# Patient Record
Sex: Male | Born: 1950 | Race: White | Hispanic: No | Smoking: Former smoker
Health system: Southern US, Community
[De-identification: ages and names within clinical notes are randomized; demographics above are authoritative.]

---

## 2007-12-18 ENCOUNTER — Other Ambulatory Visit: Payer: Self-pay

## 2007-12-18 ENCOUNTER — Inpatient Hospital Stay: Payer: Self-pay | Admitting: Specialist

## 2007-12-20 ENCOUNTER — Inpatient Hospital Stay: Payer: Self-pay | Admitting: Psychiatry

## 2008-06-14 ENCOUNTER — Observation Stay: Payer: Self-pay | Admitting: Internal Medicine

## 2008-06-14 ENCOUNTER — Inpatient Hospital Stay: Payer: Self-pay | Admitting: Unknown Physician Specialty

## 2009-08-02 ENCOUNTER — Inpatient Hospital Stay: Payer: Self-pay | Admitting: Unknown Physician Specialty

## 2010-10-28 ENCOUNTER — Emergency Department: Payer: Self-pay | Admitting: Emergency Medicine

## 2013-01-31 ENCOUNTER — Observation Stay: Payer: Self-pay | Admitting: Student

## 2013-01-31 LAB — CBC
HGB: 13.3 g/dL (ref 13.0–18.0)
MCHC: 34.3 g/dL (ref 32.0–36.0)
MCV: 95 fL (ref 80–100)
Platelet: 311 10*3/uL (ref 150–440)
RBC: 4.06 10*6/uL — ABNORMAL LOW (ref 4.40–5.90)
RDW: 14 % (ref 11.5–14.5)
WBC: 11.1 10*3/uL — ABNORMAL HIGH (ref 3.8–10.6)

## 2013-01-31 LAB — BASIC METABOLIC PANEL
Anion Gap: 7 (ref 7–16)
BUN: 9 mg/dL (ref 7–18)
Calcium, Total: 8.9 mg/dL (ref 8.5–10.1)
Co2: 25 mmol/L (ref 21–32)
Creatinine: 0.87 mg/dL (ref 0.60–1.30)
EGFR (Non-African Amer.): >60
Osmolality: 270 (ref 275–301)
Sodium: 136 mmol/L (ref 136–145)

## 2013-01-31 LAB — CK TOTAL AND CKMB (NOT AT ARMC)
CK, Total: 57 U/L (ref 35–232)
CK-MB: <0.5 ng/mL — ABNORMAL LOW (ref 0.5–3.6)

## 2013-01-31 LAB — TROPONIN I
Troponin-I: <0.02 ng/mL
Troponin-I: <0.02 ng/mL
Troponin-I: <0.02 ng/mL

## 2013-02-01 LAB — LIPID PANEL
Cholesterol: 199 mg/dL (ref 0–200)
HDL Cholesterol: 51 mg/dL (ref 40–60)
Triglycerides: 97 mg/dL (ref 0–200)
VLDL Cholesterol, Calc: 19 mg/dL (ref 5–40)

## 2013-02-01 LAB — HEMOGLOBIN A1C: Hemoglobin A1C: 5.4 % (ref 4.2–6.3)

## 2014-07-03 NOTE — Discharge Summary (Signed)
PATIENT NAME:  Randall Randall [name], Randall Randall [name] MR#:  [id number] DATE OF BIRTH:  07-09-1950  DATE OF ADMISSION:  01/31/2013 DATE OF DISCHARGE:  02/01/2013   PRIMARY CARE PHYSICIAN[facility]   CHIEF COMPLAINT: Constipation and episode of chest pain.   DISCHARGE DIAGNOSES: 1.  Chest pain, likely gastrointestinal-related secondary to constipation.  2.  History of major depression.  3.  History of alcohol abuse in the past.  4.  History of deep vein thrombosis, no longer on Coumadin.   DISCHARGE MEDICATIONS: None, but the patient was advised to take over-the-counter fiber and stool bulking agent such as Metamucil.   DISPOSITION: Home.   SIGNIFICANT LABORATORIES AND IMAGING: Troponins negative x 3, CK-MB not elevated x 3. White count of 11.1 on admission. LDL 129. Sodium 136, potassium 3.2. Chest x-ray, PA and lateral, showing COPD, no evidence of acute cardiopulmonary disease.   HISTORY OF PRESENT ILLNESS AND HOSPITAL COURSE: For full details of H and P, please see the dictation by Dr. Luberta MutterKonidena on November 21 but briefly, this is Randall [name] 64 year old without any active medical conditions. He came in for chest pain with atypical features. Pain was between the shoulder blades, going down to his belly. The patient has been feeling nausea without vomiting and has been having significant constipation. The patient has been having bowel movements daily prior, but now has not had Randall [name] bowel movement in close to Randall [name] week. He was admitted to the hospitalist service on telemetry. He underwent cyclic cardiac markers, which were all negative. He had no further episodes of chest pain. Randall [name] stress test had been ordered for the patient; however, nuclear medicine had somehow not gotten the order. The patient has been chest pain-free since he had been given aspirin in the ER. At this point, he was given Dulcolax  suppository with some relief with Randall [name] bowel movement. I believe his "chest pain" is likely GI-related, and he also states that  his pain did get better with belching. He was advised to take over-the-counter stool bulking agents. He will be discharged with outpatient followup, and he verbalized understanding that he will contact his PCP if he has any further episodes of chest pain, and Randall [name] stress test could be done as an outpatient.   TOTAL TIME SPENT: 35 minutes.   CODE STATUS: The patient is full code.   ____________________________ Krystal EatonShayiq Delanee Xin, MD sa:jcm D: 02/01/2013 13:18:38 ET T: 02/01/2013 13:53:44 ET JOB#: [id number]  cc: Krystal EatonShayiq Adyn Serna, MD, <Dictator> [facility] Marcelle SmilingSHAYIQ [facility] MD ELECTRONICALLY SIGNED 02/15/2013 19:10

## 2014-07-03 NOTE — H&P (Signed)
PATIENT NAME:  Randall [name], [name] MR#:  [id number] DATE OF BIRTH:  18-Oct-1950  DATE OF ADMISSION:  01/31/2013  PRIMARY CARE PHYSICIAN:  Nonlocal. He says he does not remember seeing a physician like a long time ago and not able to remember the name.  ER PHYSICIAN:  Dr. Darnelle CatalanMalinda.  CHIEF COMPLAINT:  Chest pain.   HISTORY OF PRESENT ILLNESS:  The patient is a 64 year old male with no past medical history, who came in because of chest pain. The patient says the chest pain started about one week ago, mainly in the middle of the chest and going back to the shoulder blades on the left side and also going down to his abdomen. The patient feels nauseous since the last two days, unable to get any vomiting. Denies any sweating, no shortness of breath. No cough. No fever, and chest pain not relieved with the Tylenol that he took and also not relieved with Advil. He also tried TUMS with no relief. The patient says that he has received aspirin in the ER that helped him with chest pain. The patient also says he is constipated for one week, tried all the stool softeners with no relief. The patient states that he is working on his roof and lifting weights for about two weeks and he does not think it is a muscle pull.  PAST MEDICAL HISTORY:  Significant for a history of depression and [facility]. The patient has a history of major depression, alcohol dependence, but not taking any medications at this time. Significant for a history of DVT. He says it is a year ago and he was on Coumadin for the past and was stopped by PMD because he has no more DVT.   ALLERGIES:  No known allergies.   SOCIAL HISTORY:  The patient says he is a smoker but not smoked for about a week, and no alcohol. No drugs.   FAMILY HISTORY:  Mother had hypertension and also his aunt has a heart attack about age 65.   PAST SURGICAL HISTORY:  None.   REVIEW OF SYSTEMS: CONSTITUTIONAL:  No fever. No fatigue.  EYES:   No blurred vision.  ENT:  No tinnitus. No ear pain. No epistaxis. No difficulty swallowing.  RESPIRATORY:  No cough. No wheezing.  CARDIOVASCULAR:  Has chest pain for a week, no orthopnea. No pedal edema. No PND. No palpitations.  GASTROINTESTINAL:  Has nausea but no vomiting. No diarrhea, does have constipation. No abdominal pain.  GENITOURINARY:  No dysuria.  ENDOCRINE:  No polyphagia. No nocturia.   hematological;>. No anemia.  INTEGUMENTARY:  No skin rashes.  MUSCULOSKELETAL:  No joint pains.  NEUROLOGIC:  No numbness or weakness.  PSYCHIATRIC:  Anxiety or insomnia.   PHYSICAL EXAMINATION:  VITAL SIGNS:  Temperature 97.7, heart rate 84, blood pressure 168/80, sats 98% on room air.  GENERAL:  Awake, oriented, Not in distress. Answers all questions appropriately.  HEENT:  Head normocephalic, atraumatic.  EYES:  Pupils equally reacting to light, no conjunctival pallor. No scleral icterus.  NOSE:  No nasal lesions.  EARS:  No tympanic membrane congestion. No drainage.  MOUTH:  No lesions.  NECK:  Supple. No JVD. No carotid bruit. Normal range of motion.  RESPIRATORY:  Good respiratory effort. Clear to auscultation. No wheeze. No rales.  CARDIOVASCULAR:  S1, S2 regular. No murmurs. No gallops. The patient has no peripheral edema. Pulses are equal in carotid ,dorsalis pedis and femoral artery.  CHEST:  The patient does not have any chest wall tenderness.  GASTROINTESTINAL:  Abdomen is soft, nontender, nondistended. Bowel sounds present.  MUSCULOSKELETAL:  The patient's extremities moves x 4. No tenderness or effusion. Normal range of motion. NEUROLOGIC:  Cranial nerves II through XII intact. Power 5/5 in upper and lower extremities.  (dtr 2+ bilaterally. sensations are intact NEUROLOGICAL:  Oriented to time, place, person. No focal neurological deficit.   LABORATORY DATA:  Troponin less than 0.02. WBC 11.1, hemoglobin 13.3, hematocrit 38.7, platelets 311.   ELECTROLYTES:  Sodium 136,  potassium 3.2, chloride 104, bicarb 25, BUN 9, creatinine 0.87, glucose 83.   Chest x-ray shows COPD with no evidence of acute disease.   EKG:  Normal sinus at 73 beats per minute. No ST-T changes.   ASSESSMENT AND PLAN:   1.  The patient is a 64 year old male with chest pain, sounds very atypical, and he says that he does feel better after burping, but because of his advanced age we want him to be admitted to observation and continue aspirin, beta blockers. Obtain fasting lipids. Continue nitroglycerin. Obtain a stress test in the morning.  2.  Hypokalemia, replace the potassium. 3.  Constipation. Use stool softeners and MiraLAX.  4.  Possible gastroesophageal reflux disease. Continue proton pump inhibitor, continue  Mylanta as needed.   TIME SPENT ON HISTORY AND PHYSICAL:  About 55 minutes.   ____________________________ Katha Hamming, MD sk:jm D: 01/31/2013 17:09:17 ET T: 01/31/2013 17:38:26 ET JOB#: [id number]  cc: Katha Hamming, MD, <Dictator> Katha Hamming MD ELECTRONICALLY SIGNED 02/19/2013 22:34

## 2017-06-25 ENCOUNTER — Other Ambulatory Visit: Payer: Self-pay | Admitting: Family Medicine

## 2017-06-25 DIAGNOSIS — M79671 Pain in right foot: Secondary | ICD-10-CM

## 2017-06-27 ENCOUNTER — Ambulatory Visit
Admission: RE | Admit: 2017-06-27 | Discharge: 2017-06-27 | Disposition: A | Discharge status: Home / Self Care | Payer: Medicare Other | Source: Ambulatory Visit | Attending: Family Medicine | Admitting: Family Medicine

## 2017-06-27 DIAGNOSIS — M79671 Pain in right foot: Secondary | ICD-10-CM | POA: Diagnosis present

## 2017-06-27 DIAGNOSIS — M19071 Primary osteoarthritis, right ankle and foot: Secondary | ICD-10-CM | POA: Insufficient documentation

## 2018-09-15 ENCOUNTER — Emergency Department
Admission: EM | Admit: 2018-09-15 | Discharge: 2018-09-15 | Disposition: A | Discharge status: Home / Self Care | Payer: Medicare Other | Attending: Emergency Medicine | Admitting: Emergency Medicine

## 2018-09-15 ENCOUNTER — Encounter: Payer: Self-pay | Admitting: Emergency Medicine

## 2018-09-15 ENCOUNTER — Emergency Department: Payer: Medicare Other

## 2018-09-15 ENCOUNTER — Other Ambulatory Visit: Payer: Self-pay

## 2018-09-15 DIAGNOSIS — Y9389 Activity, other specified: Secondary | ICD-10-CM | POA: Diagnosis not present

## 2018-09-15 DIAGNOSIS — Z87891 Personal history of nicotine dependence: Secondary | ICD-10-CM | POA: Insufficient documentation

## 2018-09-15 DIAGNOSIS — Y9289 Other specified places as the place of occurrence of the external cause: Secondary | ICD-10-CM | POA: Diagnosis not present

## 2018-09-15 DIAGNOSIS — W11XXXA Fall on and from ladder, initial encounter: Secondary | ICD-10-CM | POA: Insufficient documentation

## 2018-09-15 DIAGNOSIS — M25511 Pain in right shoulder: Secondary | ICD-10-CM

## 2018-09-15 DIAGNOSIS — S4991XA Unspecified injury of right shoulder and upper arm, initial encounter: Secondary | ICD-10-CM | POA: Insufficient documentation

## 2018-09-15 DIAGNOSIS — Y998 Other external cause status: Secondary | ICD-10-CM | POA: Insufficient documentation

## 2018-09-15 DIAGNOSIS — S99921A Unspecified injury of right foot, initial encounter: Secondary | ICD-10-CM | POA: Insufficient documentation

## 2018-09-15 DIAGNOSIS — M79671 Pain in right foot: Secondary | ICD-10-CM

## 2018-09-15 DIAGNOSIS — W19XXXA Unspecified fall, initial encounter: Secondary | ICD-10-CM

## 2018-09-15 MED ORDER — NAPROXEN 500 MG PO TABS: 500.0000 mg | ORAL_TABLET | Freq: Two times a day (BID) | ORAL | 0 refills | Status: DC

## 2018-09-15 NOTE — ED Triage Notes (Signed)
Pt reports falling onto right shoulder about 2 weeks ago, states painful to lift arm up. Pt also c/o right foot pain, ambulatory in lobby without difficulty.

## 2018-09-15 NOTE — ED Provider Notes (Signed)
[facility] Emergency Department Provider Note  ____________________________________________   First MD Initiated Contact with Patient 09/15/18 [phone]     (approximate)  I have reviewed the triage vital signs and the nursing notes.   HISTORY  Chief Complaint Shoulder Pain   HPI Randall [name] is a 68 y.o. male resents to the ED with complaint of right shoulder pain and right foot pain after he fell from a ladder 2 weeks ago.  Patient has continued to have pain in his right foot, great toe and right shoulder.  He states that is difficult to lift his right arm without using his left hand to assist.  He has continued to be ambulatory since his accident.  He denies any head injury or loss of consciousness during this event.  Currently rates his pain as a 7 out of 10.       History reviewed. No pertinent past medical history.  There are no active problems to display for this patient.   History reviewed. No pertinent surgical history.  Prior to Admission medications   Medication Sig Start Date End Date Taking? Authorizing Provider  naproxen (NAPROSYN) 500 MG tablet Take 1 tablet (500 mg total) by mouth 2 (two) times daily with a meal. 09/15/18   Johnn Hai, PA-C    Allergies Patient has no known allergies.  History reviewed. No pertinent family history.  Social History Social History   Tobacco Use  . Smoking status: Former Research scientist (life sciences)  . Smokeless tobacco: Never Used  Substance Use Topics  . Alcohol use: Not on file  . Drug use: Not on file    Review of Systems Constitutional: No fever/chills Eyes: No visual changes. ENT: No trauma. Cardiovascular: Denies chest pain. Respiratory: Denies shortness of breath. Musculoskeletal: Positive for right shoulder pain and right foot pain. Skin: Negative for rash. Neurological: Negative for headaches, focal weakness or numbness. ____________________________________________   PHYSICAL EXAM:  VITAL  SIGNS: ED Triage Vitals  Enc Vitals Group     BP 09/15/18 0940 (!) 128/57     Pulse Rate 09/15/18 0940 (!) 106     Resp 09/15/18 0940 18     Temp 09/15/18 0940 97.8 F (36.6 C)     Temp Source 09/15/18 0940 Oral     SpO2 09/15/18 0940 96 %     Weight 09/15/18 0944 175 lb (79.4 kg)     Height 09/15/18 0944 5\' 7"  (1.702 m)     Head Circumference --      Peak Flow --      Pain Score 09/15/18 0943 7     Pain Loc --      Pain Edu? --      Excl. in [place]? --    Constitutional: Alert and oriented. Well appearing and in no acute distress. Eyes: Conjunctivae are normal.  Head: Atraumatic. Neck: No stridor.   Cardiovascular: Normal rate, regular rhythm. Grossly normal heart sounds.  Good peripheral circulation. Respiratory: Normal respiratory effort.  No retractions. Lungs CTAB. Gastrointestinal: Soft and nontender. No distention.  Musculoskeletal: Examination of the right shoulder there is no gross deformity and range of motion is restricted secondary to discomfort.  No crepitus is appreciated.  Skin is intact and no discoloration present.  There is diffuse tenderness on palpation of the [facility] joint and proximal humerus.  On examination of the right foot there is no gross deformity and no discoloration of the great toe.  Skin is intact. Neurologic:  Normal speech and language.  No gross focal neurologic deficits are appreciated. No gait instability. Skin:  Skin is warm, dry and intact. No rash noted. Psychiatric: Mood and affect are normal. Speech and behavior are normal.  ____________________________________________   LABS (all labs ordered are listed, but only abnormal results are displayed)  Labs Reviewed - No data to display  RADIOLOGY  Official radiology report(s): Dg Shoulder Right  Result Date: 09/15/2018 CLINICAL DATA:  68 year old male status post fall onto right shoulder 2 weeks previously with persistent pain and decreased range of motion EXAM: RIGHT SHOULDER - 2+ VIEW  COMPARISON:  None. FINDINGS: There is no evidence of fracture or dislocation. There is no evidence of arthropathy or other focal bone abnormality. Soft tissues are unremarkable. IMPRESSION: Negative. Electronically Signed   By: Malachy MoanHeath  McCullough M.D.   On: 09/15/2018 10:23   Dg Foot Complete Right  Result Date: 09/15/2018 CLINICAL DATA:  Fall, right foot pain EXAM: RIGHT FOOT COMPLETE - 3+ VIEW COMPARISON:  06/27/2017 FINDINGS: Normal alignment without acute osseous finding or fracture. 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 No soft tissue abnormality. 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 No interval change or acute finding by plain radiography Electronically Signed   By: Judie PetitM.  Shick M.D.   On: 09/15/2018 10:24    ____________________________________________   PROCEDURES  Procedure(s) performed (including Critical Care):  Procedures   ____________________________________________   INITIAL IMPRESSION / ASSESSMENT AND PLAN / ED COURSE  As part of my medical decision making, I reviewed the following data within the electronic MEDICAL RECORD NUMBER Notes from prior ED visits and [place] Controlled Substance Database  68 year old male presents to the ED with complaint of right shoulder and right foot pain after he fell from a ladder 2 weeks ago.  He has not been taking any over-the-counter medication for his pain.  He denies any head injury or loss of consciousness during this event.  Physical exam is unremarkable.  X-rays were negative for any acute bony injury.  Patient was made aware and discharged with a prescription for naproxen 500 mg twice daily for 10 days.  He is aware that if he is not improving he should follow-up with his PCP or the orthopedic that was listed on his discharge papers.  ____________________________________________   FINAL CLINICAL IMPRESSION(S) / ED DIAGNOSES   Final diagnoses:  Acute pain of right shoulder  Acute foot pain, right  Fall, initial encounter     ED Discharge Orders         Ordered    naproxen (NAPROSYN) 500 MG tablet  2 times daily with meals     09/15/18 1040           Note:  This document was prepared using Dragon voice recognition software and may include unintentional dictation errors.    Tommi RumpsSummers, Rhonda L, PA-C 09/15/18 1045    Sharyn CreamerQuale, Mark, MD 09/15/18 1710

## 2018-09-15 NOTE — ED Notes (Signed)
See triage note  States he fell about 2 weeks ago and landed on right shoulder   States he is still having pain to shoulder esp with movement.

## 2018-09-15 NOTE — Discharge Instructions (Signed)
Follow-up with your primary care provider or Dr. Posey Pronto who is the orthopedist for [facility].  Ice and elevation for your foot as needed for discomfort.  You may also use ice to your shoulder.  Begin taking naproxen 500 mg twice daily for 10 days.  Take this medication with food to prevent stomach upset.

## 2018-11-14 ENCOUNTER — Other Ambulatory Visit: Payer: Self-pay | Admitting: Orthopedic Surgery

## 2018-11-14 DIAGNOSIS — M25511 Pain in right shoulder: Secondary | ICD-10-CM

## 2018-11-30 ENCOUNTER — Other Ambulatory Visit: Payer: Self-pay

## 2018-11-30 ENCOUNTER — Ambulatory Visit
Admission: RE | Admit: 2018-11-30 | Discharge: 2018-11-30 | Disposition: A | Discharge status: Home / Self Care | Payer: Medicare Other | Source: Ambulatory Visit | Attending: Orthopedic Surgery | Admitting: Orthopedic Surgery

## 2018-11-30 DIAGNOSIS — M25511 Pain in right shoulder: Secondary | ICD-10-CM | POA: Diagnosis present

## 2018-12-10 ENCOUNTER — Other Ambulatory Visit: Payer: Self-pay

## 2018-12-10 ENCOUNTER — Emergency Department: Payer: Medicare Other

## 2018-12-10 ENCOUNTER — Encounter: Payer: Self-pay | Admitting: Emergency Medicine

## 2018-12-10 ENCOUNTER — Inpatient Hospital Stay
Admission: EM | Admit: 2018-12-10 | Discharge: 2018-12-16 | DRG: 505 | Disposition: A | Discharge status: Skilled Nursing Facility | Payer: Medicare Other | Attending: Internal Medicine | Admitting: Internal Medicine

## 2018-12-10 DIAGNOSIS — Y990 Civilian activity done for income or pay: Secondary | ICD-10-CM

## 2018-12-10 DIAGNOSIS — S92062A Displaced intraarticular fracture of left calcaneus, initial encounter for closed fracture: Secondary | ICD-10-CM | POA: Diagnosis present

## 2018-12-10 DIAGNOSIS — W19XXXA Unspecified fall, initial encounter: Secondary | ICD-10-CM

## 2018-12-10 DIAGNOSIS — M25572 Pain in left ankle and joints of left foot: Secondary | ICD-10-CM | POA: Diagnosis present

## 2018-12-10 DIAGNOSIS — D72829 Elevated white blood cell count, unspecified: Secondary | ICD-10-CM | POA: Diagnosis present

## 2018-12-10 DIAGNOSIS — Z419 Encounter for procedure for purposes other than remedying health state, unspecified: Secondary | ICD-10-CM

## 2018-12-10 DIAGNOSIS — Z20828 Contact with and (suspected) exposure to other viral communicable diseases: Secondary | ICD-10-CM | POA: Diagnosis present

## 2018-12-10 DIAGNOSIS — Z87891 Personal history of nicotine dependence: Secondary | ICD-10-CM

## 2018-12-10 DIAGNOSIS — G8929 Other chronic pain: Secondary | ICD-10-CM | POA: Diagnosis present

## 2018-12-10 DIAGNOSIS — S52132A Displaced fracture of neck of left radius, initial encounter for closed fracture: Secondary | ICD-10-CM | POA: Diagnosis present

## 2018-12-10 DIAGNOSIS — Y929 Unspecified place or not applicable: Secondary | ICD-10-CM | POA: Diagnosis not present

## 2018-12-10 DIAGNOSIS — W12XXXA Fall on and from scaffolding, initial encounter: Secondary | ICD-10-CM | POA: Diagnosis present

## 2018-12-10 DIAGNOSIS — S92009A Unspecified fracture of unspecified calcaneus, initial encounter for closed fracture: Secondary | ICD-10-CM | POA: Diagnosis present

## 2018-12-10 DIAGNOSIS — S92002A Unspecified fracture of left calcaneus, initial encounter for closed fracture: Secondary | ICD-10-CM

## 2018-12-10 DIAGNOSIS — S52125A Nondisplaced fracture of head of left radius, initial encounter for closed fracture: Secondary | ICD-10-CM | POA: Diagnosis present

## 2018-12-10 DIAGNOSIS — Z23 Encounter for immunization: Secondary | ICD-10-CM

## 2018-12-10 LAB — CBC WITH DIFFERENTIAL/PLATELET
Abs Immature Granulocytes: 0.04 10*3/uL (ref 0.00–0.07)
Basophils Absolute: 0 10*3/uL (ref 0.0–0.1)
Basophils Relative: 0 %
Eosinophils Absolute: 0.1 10*3/uL (ref 0.0–0.5)
Eosinophils Relative: 1 %
HCT: 39.2 % (ref 39.0–52.0)
Hemoglobin: 13.3 g/dL (ref 13.0–17.0)
Immature Granulocytes: 0 %
Lymphocytes Relative: 18 %
Lymphs Abs: 2.4 10*3/uL (ref 0.7–4.0)
MCH: 31.4 pg (ref 26.0–34.0)
MCHC: 33.9 g/dL (ref 30.0–36.0)
MCV: 92.7 fL (ref 80.0–100.0)
Monocytes Absolute: 1.2 10*3/uL — ABNORMAL HIGH (ref 0.1–1.0)
Monocytes Relative: 9 %
Neutro Abs: 9.4 10*3/uL — ABNORMAL HIGH (ref 1.7–7.7)
Neutrophils Relative %: 72 %
Platelets: 207 10*3/uL (ref 150–400)
RBC: 4.23 MIL/uL (ref 4.22–5.81)
RDW: 12.6 % (ref 11.5–15.5)
WBC: 13.3 10*3/uL — ABNORMAL HIGH (ref 4.0–10.5)
nRBC: 0 % (ref 0.0–0.2)

## 2018-12-10 LAB — BASIC METABOLIC PANEL
Anion gap: 8 (ref 5–15)
BUN: 17 mg/dL (ref 8–23)
CO2: 25 mmol/L (ref 22–32)
Calcium: 9.1 mg/dL (ref 8.9–10.3)
Chloride: 105 mmol/L (ref 98–111)
Creatinine, Ser: 0.9 mg/dL (ref 0.61–1.24)
GFR calc Af Amer: >60 mL/min (ref >60)
GFR calc non Af Amer: >60 mL/min (ref >60)
Glucose, Bld: 102 mg/dL — ABNORMAL HIGH (ref 70–99)
Potassium: 3.8 mmol/L (ref 3.5–5.1)
Sodium: 138 mmol/L (ref 135–145)

## 2018-12-10 MED ORDER — DOCUSATE SODIUM 100 MG PO CAPS
100.0000 mg | ORAL_CAPSULE | Freq: Two times a day (BID) | ORAL | Status: DC
  Administered 2018-12-10 – 2018-12-16 (×11): 100 mg via ORAL
  Filled 2018-12-10 (×12): qty 1

## 2018-12-10 MED ORDER — ENOXAPARIN SODIUM 40 MG/0.4ML ~~LOC~~ SOLN
40.0000 mg | Freq: Every day | SUBCUTANEOUS | Status: DC
  Administered 2018-12-10 – 2018-12-11 (×2): 40 mg via SUBCUTANEOUS
  Filled 2018-12-10 (×2): qty 0.4

## 2018-12-10 MED ORDER — ACETAMINOPHEN 325 MG PO TABS: 650.0000 mg | ORAL_TABLET | Freq: Four times a day (QID) | ORAL | Status: DC | PRN

## 2018-12-10 MED ORDER — ONDANSETRON HCL 4 MG/2ML IJ SOLN: 4.0000 mg | Freq: Four times a day (QID) | INTRAMUSCULAR | Status: DC | PRN

## 2018-12-10 MED ORDER — ONDANSETRON HCL 4 MG/2ML IJ SOLN
4.0000 mg | Freq: Once | INTRAMUSCULAR | Status: AC
  Administered 2018-12-10: 4 mg via INTRAVENOUS
  Filled 2018-12-10: qty 2

## 2018-12-10 MED ORDER — OXYCODONE-ACETAMINOPHEN 5-325 MG PO TABS
1.0000 | ORAL_TABLET | Freq: Once | ORAL | Status: AC
  Administered 2018-12-10: 17:00:00 1 via ORAL
  Filled 2018-12-10: qty 1

## 2018-12-10 MED ORDER — ONDANSETRON HCL 4 MG PO TABS: 4.0000 mg | ORAL_TABLET | Freq: Four times a day (QID) | ORAL | Status: DC | PRN

## 2018-12-10 MED ORDER — KETOROLAC TROMETHAMINE 15 MG/ML IJ SOLN
15.0000 mg | Freq: Four times a day (QID) | INTRAMUSCULAR | Status: DC | PRN
  Administered 2018-12-10 – 2018-12-11 (×2): 15 mg via INTRAVENOUS
  Filled 2018-12-10 (×3): qty 1

## 2018-12-10 MED ORDER — ACETAMINOPHEN 650 MG RE SUPP: 650.0000 mg | Freq: Four times a day (QID) | RECTAL | Status: DC | PRN

## 2018-12-10 MED ORDER — MORPHINE SULFATE (PF) 2 MG/ML IV SOLN
2.0000 mg | INTRAVENOUS | Status: DC | PRN
  Administered 2018-12-13 – 2018-12-14 (×2): 2 mg via INTRAVENOUS
  Filled 2018-12-10 (×2): qty 1

## 2018-12-10 MED ORDER — MORPHINE SULFATE (PF) 4 MG/ML IV SOLN
4.0000 mg | Freq: Once | INTRAVENOUS | Status: AC
  Administered 2018-12-10: 4 mg via INTRAVENOUS
  Filled 2018-12-10: qty 1

## 2018-12-10 MED ORDER — FENTANYL CITRATE (PF) 100 MCG/2ML IJ SOLN
75.0000 ug | Freq: Once | INTRAMUSCULAR | Status: AC
  Administered 2018-12-10: 75 ug via INTRAVENOUS
  Filled 2018-12-10: qty 2

## 2018-12-10 NOTE — ED Notes (Signed)
Pt to Xray.

## 2018-12-10 NOTE — ED Notes (Signed)
Pt given telephone to make a phone call at this time

## 2018-12-10 NOTE — Progress Notes (Signed)
Family Meeting Note  Advance Directive:yes  Today a meeting took place with the Patient.    The following clinical team members were present during this meeting:MD  The following were discussed:Patient's diagnosis: Left ankle pain, left elbow pain, fall leukocytosis will be admitted to the hospital.  Plan of care discussed in detail with the patient.  He verbalized understanding of the plan  , Patient's progosis: > 12 months and Goals for treatment: Full Code  Dr. Marca Ancona is the healthcare power of attorney  Additional follow-up to be provided: Hospitalist orthopedics and podiatry  Time spent during discussion:17 min  Nicholes Mango, MD

## 2018-12-10 NOTE — ED Provider Notes (Addendum)
[facility] Emergency Department Provider Note  ____________________________________________   First MD Initiated Contact with Patient 12/10/18 1201     (approximate)  I have reviewed the triage vital signs and the nursing notes.  History  Chief Complaint Fall    HPI Randall [name] is a 68 y.o. male with no significant medical history who presents emergency department for a fall.  Patient states he fell approximately 6 or 8 feet off of a scaffolding.  He states he slipped off, causing the fall.  He denies any preceding presyncopal symptoms - no chest pain, palpitations, lightheadedness, dizziness, shortness of breath.  He reports pain to his bilateral hands, left forearm/elbow area, and particularly to his left ankle, where there is an obvious deformity.  Pain is 10/10 in severity.  He denies any head injury, loss of consciousness.  He is not on any blood thinning medications.  He denies any head pain, neck pain, back pain, chest pain, abdominal pain, hip pain, or other extremity pain aside from above.  Mechanism: fall   Past Medical Hx History reviewed. No pertinent past medical history.  Problem List There are no active problems to display for this patient.   Past Surgical Hx History reviewed. No pertinent surgical history.  Medications Prior to Admission medications   Medication Sig Start Date End Date Taking? Authorizing Provider  naproxen (NAPROSYN) 500 MG tablet Take 1 tablet (500 mg total) by mouth 2 (two) times daily with a meal. 09/15/18   Johnn Hai, PA-C    Allergies Patient has no known allergies.  Family Hx No family history on file.  Social Hx Social History   Tobacco Use  . Smoking status: Former Research scientist (life sciences)  . Smokeless tobacco: Never Used  Substance Use Topics  . Alcohol use: Never    Frequency: Never  . Drug use: Never     Review of Systems  Constitutional: Negative for fever. Negative for chills. Eyes:  Negative for visual changes. ENT: Negative for sore throat. Cardiovascular: Negative for chest pain. Respiratory: Negative for shortness of breath. Gastrointestinal: Negative for abdominal pain. Negative for nausea. Negative for vomiting. Genitourinary: Negative for dysuria. Musculoskeletal: + left ankle pain, bilateral hand pain Skin: Negative for rash. Neurological: Negative for for headaches.   Physical Exam  Vital Signs: ED Triage Vitals  Enc Vitals Group     BP 12/10/18 1100 (!) 148/84     Pulse Rate 12/10/18 1100 (!) 110     Resp 12/10/18 1100 13     Temp 12/10/18 1101 98.3 F (36.8 C)     Temp Source 12/10/18 1101 Oral     SpO2 12/10/18 1100 98 %     Weight 12/10/18 1101 175 lb (79.4 kg)     Height 12/10/18 1101 5\' 7"  (1.702 m)     Head Circumference --      Peak Flow --      Pain Score 12/10/18 1101 10     Pain Loc --      Pain Edu? --      Excl. in [place]? --     Constitutional: Alert and oriented. [place] 15.  Eyes: Conjunctivae clear. Sclera anicteric. EOMI.  Head: Normocephalic. Atraumatic. Nose: No epistaxis. No nasal septal hematomas.  Mouth/Throat: No intraoral or dental trauma. Jaw is well aligned.  Neck: No midline CS tenderness. No stridor.  Arrives in c-collar. Cardiovascular: Normal rate, regular rhythm. No murmurs. Extremities well perfused. Chest: Chest wall is stable and NT. No crepitance.  Respiratory: Normal respiratory effort.  Lungs CTAB. Gastrointestinal: Soft and non-tender. No distention.  Pelvis: Stable and NT to AP and lateral compression.  Musculoskeletal: Obvious deformity and swelling about the left ankle, toes are warm and well-perfused, cap refill less than 2 seconds, 2+ palpable DP pulse.  Bilateral hands are tender over the thenar eminence, with some mild associated ecchymosis, but no obvious deformities. Swelling about the left elbow, able to range. Motor/sensation intact in bilateral ulnar, median, radial distribution. Back: No midline  C/T/L spine tenderness.  Neurologic:  Normal speech and language. Moves all extremities equally. GCS 15.  Skin: Skin is warm, dry and intact.  Small abrasion to the left anterior knee. Ecchymosis to the bilateral proximal hands, palmar aspect. Psychiatric: Mood and affect are appropriate for situation.  EKG  N/A  Radiology  CT head, CS, T, L spine: negative.   Notable XRs:  XR left elbow: IMPRESSION:  There is a subtle, angulated fracture of the left radial neck, in  keeping with suspicion on prior forearm radiographs. Large elbow  joint effusion.   XR left ankle: IMPRESSION:  1. No fracture or dislocation of the left tibia or fibula.   2. There are comminuted, displaced fractures of the body of the left  calcaneus, involving the subtalar joint. The ankle mortise proper  and talus are intact. Soft tissue edema about the ankle and heel.    Procedures  Procedure(s) performed (including critical care):  .Splint Application  Date/Time: 12/11/2018 1:18 PM Performed by: Miguel Aschoff., MD Authorized by: Miguel Aschoff., MD   Consent:    Consent obtained:  Verbal   Consent given by:  Parent   Risks discussed:  Pain, swelling and numbness Pre-procedure details:    Sensation:  Normal Procedure details:    Laterality:  Left   Location:  Ankle   Ankle:  L ankle   Splint type: posterior splint with stirrup.   Supplies:  Cotton padding, elastic bandage and Ortho-Glass Post-procedure details:    Pain:  Improved   Sensation:  Normal   Patient tolerance of procedure:  Tolerated well, no immediate complications Comments:     Supervised ED tech splint application     Initial Impression / Assessment and Plan / ED Course  68 y.o. male who presents to the ED for fall, as above.   Plan: CT head/CS imaging, XR imaging of affected extremities  Injuries: calcaneal fracture, radial head fracture  For radial head: per orthopedics, sling, NWB, follow up in clinic.    Calcaneal injury: splint, per podiatry will likely operate in ~1 week.   Given calcaneal injury, CT spine imaging obtained to rule out compression fx 2/2 axial loading - negative.   Unfortunately with his injuries both being on the left side, he will require admission for PT/OT as he is unable to ambulate safely with the above injuries. Discussed w/ hospitalist for admission.    Final Clinical Impression(s) / ED Diagnosis  Final diagnoses:  Fall, initial encounter  Closed displaced fracture of left calcaneus, unspecified portion of calcaneus, initial encounter  Closed nondisplaced fracture of head of left radius, initial encounter       Note:  This document was prepared using Dragon voice recognition software and may include unintentional dictation errors.   Miguel Aschoff., MD 12/11/18 [place]    Miguel Aschoff., MD 12/11/18 [phone]

## 2018-12-10 NOTE — ED Triage Notes (Signed)
Pt in via POV, pt reports falling approximately 10 feet off of a scaffold, landing on left side, denies hitting head, positive LOC.  Pt A/Ox4, complaints of left arm, leg and neck pain.  Tachycardic, other vitals WDL.

## 2018-12-10 NOTE — ED Notes (Signed)
Attempted a 2nd call to give report, accepting RN and Camera operator unavailable.

## 2018-12-10 NOTE — ED Notes (Addendum)
Pt employer [employer] contacted at [phone] at this time. States he does not wish to have a drug screening completed. Pt states he does not wish to file workers comp at this time. Pt states he has only worked there for less than a week, and has not drawn any monetary compensation at this time.

## 2018-12-10 NOTE — ED Notes (Signed)
Attempted to call pt's employment ([employer] [phone]) but no answer at this time. Unable to leave a message. Pt reports managers name "Sheral Apley" as person of contact for [employer] information

## 2018-12-10 NOTE — ED Notes (Signed)
Pt spoke to daughter on phone. Pt daughter number written onto sheet for pt to have her number at bedside. Pt daughter name Theadora Rama- number [phone]

## 2018-12-10 NOTE — H&P (Signed)
[facility] - [place] at [facility]   PATIENT NAME: Randall [name]    MR#:  [id number]  DATE OF BIRTH:  1951-01-17  DATE OF ADMISSION:  12/10/2018  PRIMARY CARE PHYSICIAN: Emogene Morgan, MD   REQUESTING/REFERRING PHYSICIAN:   CHIEF COMPLAINT:   Fall HISTORY OF PRESENT ILLNESS:  Randall [name]  is a 67 y.o. 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  X-rays have revealed left calcaneal fracture and left radial neck fracture.  ED physician has consulted podiatry Dr. Ether Griffins and orthopedics Dr. Signa Kell.  Both are not considering surgery at this time and recommended nonweightbearing splint for the foot and sling for the left arm.  Hospitalist team is called to admit the patient for pain management and PT assessment  PAST MEDICAL HISTORY:  History reviewed. No pertinent past medical history.  PAST SURGICAL HISTOIRY:  History reviewed. No pertinent surgical history.  SOCIAL HISTORY:   Social History   Tobacco Use  . Smoking status: Former Games developer  . Smokeless tobacco: Never Used  Substance Use Topics  . Alcohol use: Never    Frequency: Never    FAMILY HISTORY:  No family history on file.  DRUG ALLERGIES:   Allergies  Allergen Reactions  . Quetiapine Other (See Comments)    Suicidal thoughts    REVIEW OF SYSTEMS:  CONSTITUTIONAL: No fever, fatigue or weakness.  EYES: No blurred or double vision.  EARS, NOSE, AND THROAT: No tinnitus or ear pain.  RESPIRATORY: No cough, shortness of breath, wheezing or hemoptysis.  CARDIOVASCULAR: No chest pain, orthopnea, edema.  GASTROINTESTINAL: No nausea, vomiting, diarrhea or abdominal pain.  GENITOURINARY: No dysuria, hematuria.  ENDOCRINE: No polyuria, nocturia,  HEMATOLOGY: No anemia, easy bruising or bleeding SKIN: No rash or lesion. MUSCULOSKELETAL: Left foot pain and left elbow pain NEUROLOGIC: No tingling, numbness, weakness.   PSYCHIATRY: No anxiety or depression.   MEDICATIONS AT HOME:   Prior to Admission medications   Medication Sig Start Date End Date Taking? Authorizing Provider  naproxen (NAPROSYN) 500 MG tablet Take 1 tablet (500 mg total) by mouth 2 (two) times daily with a meal. Patient not taking: Reported on 12/10/2018 09/15/18   Tommi Rumps, PA-C      VITAL SIGNS:  Blood pressure (!) 124/94, pulse 85, temperature 98.3 F (36.8 C), temperature source Oral, resp. rate 15, height  (1.702 m), weight 79.4 kg, SpO2 99 %.  PHYSICAL EXAMINATION:  GENERAL:  68 y.o.-year-old patient lying in the bed with no acute distress.  EYES: Pupils equal, round, reactive to light and accommodation. No scleral icterus. Extraocular muscles intact.  HEENT: Head atraumatic, normocephalic. Oropharynx and nasopharynx clear.  NECK:  Supple, no jugular venous distention. No thyroid enlargement, no tenderness.  LUNGS: Normal breath sounds bilaterally, no wheezing, rales,rhonchi or crepitation. No use of accessory muscles of respiration.  CARDIOVASCULAR: S1, S2 normal. No murmurs, rubs, or gallops.  ABDOMEN: Soft, nontender, nondistended. Bowel soun Extremities left foot is tender in a splint and left elbow is tender in the sling  no pedal edema, cyanosis, or clubbing.  NEUROLOGIC: Cranial nerves II through XII are intact. Muscle strength 5/5 in all right-sided extremities. Sensation intact. Gait not checked.  PSYCHIATRIC: The patient is alert and oriented x 3.  SKIN: No obvious rash, lesion, or ulcer.   LABORATORY PANEL:   CBC Recent Labs  Lab 12/10/18 1738  WBC 13.3*  HGB 13.3  HCT 39.2  PLT 207   ------------------------------------------------------------------------------------------------------------------  Chemistries  Recent Labs  Lab 12/10/18 1738  NA 138  K 3.8  CL 105  CO2 25  GLUCOSE 102*  BUN 17  CREATININE 0.90  CALCIUM 9.1    ------------------------------------------------------------------------------------------------------------------  Cardiac Enzymes No results for input(s): TROPONINI in the last 168 hours. ------------------------------------------------------------------------------------------------------------------  RADIOLOGY:  Dg Chest 2 View  Result Date: 12/10/2018 CLINICAL DATA:  Fall EXAM: CHEST - 2 VIEW COMPARISON:  01/31/2013 FINDINGS: The heart size and mediastinal contours are within normal limits. Both lungs are clear. Disc degenerative disease of the thoracic spine. Nonacute fracture deformities of the posterior left ribs. IMPRESSION: No acute abnormality of the lungs. Electronically Signed   By: Lauralyn Primes M.D.   On: 12/10/2018 13:16   Dg Elbow Complete Left  Result Date: 12/10/2018 CLINICAL DATA:  Fall EXAM: LEFT ELBOW - COMPLETE 3+ VIEW COMPARISON:  Same day forearm radiographs FINDINGS: There is a subtle, angulated fracture of the left radial neck. Large elbow joint effusion. Soft tissues are unremarkable. IMPRESSION: There is a subtle, angulated fracture of the left radial neck, in keeping with suspicion on prior forearm radiographs. Large elbow joint effusion. Electronically Signed   By: Lauralyn Primes M.D.   On: 12/10/2018 14:10   Dg Forearm Left  Result Date: 12/10/2018 CLINICAL DATA:  Fall EXAM: LEFT FOREARM - 2 VIEW COMPARISON:  None. FINDINGS: No definite fracture or dislocation of the left forearm. There may be subtle angulation of the left radial head and neck best seen on frontal view. Suspect elbow joint effusion. Included joint spaces are preserved. Intravenous catheter over the ventral soft tissues of the forearm. IMPRESSION: No definite fracture or dislocation of the left forearm. There may be subtle angulation of the left radial head and neck best seen on frontal view. Suspect elbow joint effusion. Correlate for acute point tenderness and consider dedicated elbow radiographs if  indicated by clinical suspicion. Electronically Signed   By: Lauralyn Primes M.D.   On: 12/10/2018 13:20   Dg Forearm Right  Result Date: 12/10/2018 CLINICAL DATA:  Fall EXAM: RIGHT HAND - COMPLETE 3+ VIEW; RIGHT FOREARM - 2 VIEW COMPARISON:  None. FINDINGS: No fracture or dislocation of the right hand or right forearm. Joint spaces are well preserved. Superficial radiopaque debris about the distal digits. Soft tissues are otherwise unremarkable. IMPRESSION: No fracture or dislocation of the right hand or right forearm. Electronically Signed   By: Lauralyn Primes M.D.   On: 12/10/2018 13:18   Dg Tibia/fibula Left  Result Date: 12/10/2018 CLINICAL DATA:  Fall EXAM: LEFT ANKLE COMPLETE - 3+ VIEW; LEFT TIBIA AND FIBULA - 2 VIEW COMPARISON:  None. FINDINGS: No fracture or dislocation of the left tibia or fibula. Partially imaged knee is intact. There are comminuted, displaced fractures of the body of the left calcaneus, involving the subtalar joint. The ankle mortise proper and talus are intact. Soft tissue edema about the ankle and heel. IMPRESSION: 1. No fracture or dislocation of the left tibia or fibula. 2. There are comminuted, displaced fractures of the body of the left calcaneus, involving the subtalar joint. The ankle mortise proper and talus are intact. Soft tissue edema about the ankle and heel. Electronically Signed   By: Lauralyn Primes M.D.   On: 12/10/2018 13:23   Dg Ankle Complete Left  Result Date: 12/10/2018 CLINICAL DATA:  Fall EXAM: LEFT ANKLE COMPLETE - 3+ VIEW; LEFT TIBIA AND FIBULA - 2 VIEW COMPARISON:  None. FINDINGS: No  fracture or dislocation of the left tibia or fibula. Partially imaged knee is intact. There are comminuted, displaced fractures of the body of the left calcaneus, involving the subtalar joint. The ankle mortise proper and talus are intact. Soft tissue edema about the ankle and heel. IMPRESSION: 1. No fracture or dislocation of the left tibia or fibula. 2. There are comminuted,  displaced fractures of the body of the left calcaneus, involving the subtalar joint. The ankle mortise proper and talus are intact. Soft tissue edema about the ankle and heel. Electronically Signed   By: Eddie Candle M.D.   On: 12/10/2018 13:23   Ct Head Wo Contrast  Result Date: 12/10/2018 CLINICAL DATA:  Fall 10 free EXAM: CT HEAD WITHOUT CONTRAST CT CERVICAL SPINE WITHOUT CONTRAST TECHNIQUE: Multidetector CT imaging of the head and cervical spine was performed following the standard protocol without intravenous contrast. Multiplanar CT image reconstructions of the cervical spine were also generated. COMPARISON:  None. FINDINGS: CT HEAD FINDINGS Brain: No acute intracranial abnormality. Specifically, no hemorrhage, hydrocephalus, mass lesion, acute infarction, or significant intracranial injury. Vascular: No hyperdense vessel or unexpected calcification. Skull: No acute calvarial abnormality. Sinuses/Orbits: Visualized paranasal sinuses and mastoids clear. Orbital soft tissues unremarkable. Other: None CT CERVICAL SPINE FINDINGS Alignment: Normal Skull base and vertebrae: No acute fracture. No primary bone lesion or focal pathologic process. Soft tissues and spinal canal: No prevertebral fluid or swelling. No visible canal hematoma. Disc levels: Degenerative spurring anteriorly from C4-5 through C6-7. Disc spaces maintained. Upper chest: Negative Other: None IMPRESSION: No acute intracranial abnormality. No acute bony abnormality in the cervical spine. Electronically Signed   By: Rolm Baptise M.D.   On: 12/10/2018 11:52   Ct Cervical Spine Wo Contrast  Result Date: 12/10/2018 CLINICAL DATA:  Fall 10 free EXAM: CT HEAD WITHOUT CONTRAST CT CERVICAL SPINE WITHOUT CONTRAST TECHNIQUE: Multidetector CT imaging of the head and cervical spine was performed following the standard protocol without intravenous contrast. Multiplanar CT image reconstructions of the cervical spine were also generated. COMPARISON:  None.  FINDINGS: CT HEAD FINDINGS Brain: No acute intracranial abnormality. Specifically, no hemorrhage, hydrocephalus, mass lesion, acute infarction, or significant intracranial injury. Vascular: No hyperdense vessel or unexpected calcification. Skull: No acute calvarial abnormality. Sinuses/Orbits: Visualized paranasal sinuses and mastoids clear. Orbital soft tissues unremarkable. Other: None CT CERVICAL SPINE FINDINGS Alignment: Normal Skull base and vertebrae: No acute fracture. No primary bone lesion or focal pathologic process. Soft tissues and spinal canal: No prevertebral fluid or swelling. No visible canal hematoma. Disc levels: Degenerative spurring anteriorly from C4-5 through C6-7. Disc spaces maintained. Upper chest: Negative Other: None IMPRESSION: No acute intracranial abnormality. No acute bony abnormality in the cervical spine. Electronically Signed   By: Rolm Baptise M.D.   On: 12/10/2018 11:52   Ct Thoracic Spine Wo Contrast  Result Date: 12/10/2018 CLINICAL DATA:  Status post fall, fell from 6-8 scaffolding onto concrete EXAM: CT THORACIC AND LUMBAR SPINE WITHOUT CONTRAST TECHNIQUE: Multidetector CT imaging of the thoracic and lumbar spine was performed without contrast. Multiplanar CT image reconstructions were also generated. COMPARISON:  None. FINDINGS: CT THORACIC SPINE FINDINGS Alignment: Normal. Vertebrae: No acute fracture or focal pathologic process. Paraspinal and other soft tissues: No acute paraspinal abnormality. Thoracic aortic atherosclerosis. Disc levels: Disc spaces are relatively well maintained. No foraminal or central canal stenosis. Anterior bridging osteophytes from T5 through T12 as can be seen with diffuse idiopathic skeletal hyperostosis. CT LUMBAR SPINE FINDINGS Segmentation: 5 lumbar type vertebrae. Alignment: Normal. Vertebrae:  No acute fracture or focal pathologic process. Paraspinal and other soft tissues: No acute paraspinal abnormality. Abdominal aortic  atherosclerosis. Other: Mild osteoarthritis of bilateral sacroiliac joints. Disc levels: Disc spaces are maintained. Mild broad-based disc bulge at L3-4 with mild bilateral facet arthropathy. Mild broad-based disc bulge at L4-5 with mild bilateral facet arthropathy. Mild broad-based disc bulge at L5-S1 with bilateral facet arthropathy. No foraminal stenosis of the lumbar spine. No central canal stenosis of the lumbar spine. IMPRESSION: CT THORACIC SPINE IMPRESSION 1.  No acute osseous injury of the thoracic spine. CT LUMBAR SPINE IMPRESSION 1.  No acute osseous injury of the lumbar spine. 2.  Aortic Atherosclerosis (ICD10-I70.0). Electronically Signed   By: Elige Ko   On: 12/10/2018 13:56   Ct Lumbar Spine Wo Contrast  Result Date: 12/10/2018 CLINICAL DATA:  Status post fall, fell from 6-8 scaffolding onto concrete EXAM: CT THORACIC AND LUMBAR SPINE WITHOUT CONTRAST TECHNIQUE: Multidetector CT imaging of the thoracic and lumbar spine was performed without contrast. Multiplanar CT image reconstructions were also generated. COMPARISON:  None. FINDINGS: CT THORACIC SPINE FINDINGS Alignment: Normal. Vertebrae: No acute fracture or focal pathologic process. Paraspinal and other soft tissues: No acute paraspinal abnormality. Thoracic aortic atherosclerosis. Disc levels: Disc spaces are relatively well maintained. No foraminal or central canal stenosis. Anterior bridging osteophytes from T5 through T12 as can be seen with diffuse idiopathic skeletal hyperostosis. CT LUMBAR SPINE FINDINGS Segmentation: 5 lumbar type vertebrae. Alignment: Normal. Vertebrae: No acute fracture or focal pathologic process. Paraspinal and other soft tissues: No acute paraspinal abnormality. Abdominal aortic atherosclerosis. Other: Mild osteoarthritis of bilateral sacroiliac joints. Disc levels: Disc spaces are maintained. Mild broad-based disc bulge at L3-4 with mild bilateral facet arthropathy. Mild broad-based disc bulge at L4-5 with  mild bilateral facet arthropathy. Mild broad-based disc bulge at L5-S1 with bilateral facet arthropathy. No foraminal stenosis of the lumbar spine. No central canal stenosis of the lumbar spine. IMPRESSION: CT THORACIC SPINE IMPRESSION 1.  No acute osseous injury of the thoracic spine. CT LUMBAR SPINE IMPRESSION 1.  No acute osseous injury of the lumbar spine. 2.  Aortic Atherosclerosis (ICD10-I70.0). Electronically Signed   By: Elige Ko   On: 12/10/2018 13:56   Ct Ankle Left Wo Contrast  Result Date: 12/10/2018 CLINICAL DATA:  Status post fall.  Comminuted calcaneal fracture. EXAM: CT OF THE LEFT ANKLE WITHOUT CONTRAST TECHNIQUE: Multidetector CT imaging of the left ankle was performed according to the standard protocol. Multiplanar CT image reconstructions were also generated. COMPARISON:  Radiographs same date. FINDINGS: Bones/Joint/Cartilage There is a comminuted, intra-articular compression fracture of the calcaneus. This fracture has a component involving the posterior subtalar joint which is depressed by up to 3 mm. There is a nondisplaced component extending into the mid subtalar facet. There is a mildly displaced component extending into the lateral aspect of the calcaneocuboid articulation. The cortex of the calcaneal body laterally is moderately displaced without peroneal tendon entrapment. Small fracture fragments are also present along the posterior process of the talus, best seen on the sagittal images. The talus otherwise appears intact. No other tarsal bone fractures are identified. The distal tibia and fibula are intact. Well corticated ossicles are present along the distal fibular tip. Ligaments Suboptimally assessed by CT. Muscles and Tendons No tendon rupture or entrapment identified. There is a tiny type 1 accessory navicular. There is also prominent os peroneum. Soft tissues Soft tissue swelling in the ankle and hindfoot without focal fluid collection. IMPRESSION: 1. Comminuted  intra-articular compression  fracture of the calcaneus as described, demonstrating a centrolateral depression configuration. There are mildly displaced components involving the posterior facet of the subtalar joint and the calcaneocuboid articulation. 2. No other significant tarsal bone fractures. Possible tiny avulsion fractures from the posterior process of the talus. 3. No evidence of ankle tendon entrapment or rupture. Electronically Signed   By: Carey BullocksWilliam  Veazey M.D.   On: 12/10/2018 16:41   Dg Hand Complete Left  Result Date: 12/10/2018 CLINICAL DATA:  Recent fall from scaffolding with hand pain, initial encounter EXAM: LEFT HAND - COMPLETE 3+ VIEW COMPARISON:  None. FINDINGS: No acute fracture is noted. Deformity at the base of the fifth metacarpal is noted consistent with prior healed fracture. No soft tissue abnormality is seen. IMPRESSION: No acute abnormality noted. Electronically Signed   By: Alcide CleverMark  Lukens M.D.   On: 12/10/2018 13:16   Dg Hand Complete Right  Result Date: 12/10/2018 CLINICAL DATA:  Fall EXAM: RIGHT HAND - COMPLETE 3+ VIEW; RIGHT FOREARM - 2 VIEW COMPARISON:  None. FINDINGS: No fracture or dislocation of the right hand or right forearm. Joint spaces are well preserved. Superficial radiopaque debris about the distal digits. Soft tissues are otherwise unremarkable. IMPRESSION: No fracture or dislocation of the right hand or right forearm. Electronically Signed   By: Lauralyn PrimesAlex  Bibbey M.D.   On: 12/10/2018 13:18    EKG:   Orders placed or performed during the hospital encounter of 12/10/18  . EKG 12-Lead  . EKG 12-Lead    IMPRESSION AND PLAN:     #Acute left foot pain from left calcaneal fracture Admit to orthopedics unit Podiatry consult placed to Dr. Ether GriffinsFowler Recommending splint placement and nonweightbearing, not considering surgery at this point of time Pain management as needed CT head is negative  #Left radial neck fracture Consult placed to orthopedics Dr. Signa KellSunny  Patel Recommending sling and nonweightbearing on the left hand Pain management as needed PT consult placed  #Leukocytosis probably reactive Patient is afebrile Monitor clinically  All the records are reviewed and case discussed with ED provider. Management plans discussed with the patient, he is  in agreement.  CODE STATUS:  fc   TOTAL TIME TAKING CARE OF THIS PATIENT: 45  minutes.   Note: This dictation was prepared with Dragon dictation along with smaller phrase technology. Any transcriptional errors that result from this process are unintentional.  Ramonita LabAruna Floyd Wade M.D on 12/10/2018 at 8:44 PM  Between 7am to 6pm - Pager - [phone]  After 6pm go to [email or web address] - password [email or web address] [facility]  [facility]  Office  [phone]  CC: Primary care physician; Emogene MorganAycock, Ngwe A, MD

## 2018-12-10 NOTE — ED Notes (Signed)
Attempted to call report to floor., informed per unit secretary that RN will call back in minutes

## 2018-12-10 NOTE — Progress Notes (Signed)
Report called from Aleene Davidson @ 2156, this RN was in a patient's room at that time, requested for a call back. At 2205, Judson Roch attempted to call this RN again, again this RN was in another patient's room attending to BP issues, this RN asked for nurse to hold for 5 minutes. Nurse, Judson Roch, requested to speak with the charge nurse on this floor who was also in a patient's room. Came out of patient's room @ 2208 and attempted to call Judson Roch back @ 2209 twice and @ 2210 once with no avail. At 2215, received a call from [place], ED charge RN, stating that patient is on his way to floor as it will be a rolling report and if the accepting nurse has any questions they can call the Judson Roch. Informed Nikki, Camera operator of this issue. Charge nurse will follow up. Called Judson Roch back again @ 2219 with no answer and again at 2220 when she eventually answered and gave report.

## 2018-12-11 DIAGNOSIS — W19XXXA Unspecified fall, initial encounter: Secondary | ICD-10-CM

## 2018-12-11 DIAGNOSIS — S92002A Unspecified fracture of left calcaneus, initial encounter for closed fracture: Secondary | ICD-10-CM

## 2018-12-11 DIAGNOSIS — S52125A Nondisplaced fracture of head of left radius, initial encounter for closed fracture: Secondary | ICD-10-CM

## 2018-12-11 LAB — SARS CORONAVIRUS 2 (TAT 6-24 HRS): SARS Coronavirus 2: NEGATIVE

## 2018-12-11 LAB — COMPREHENSIVE METABOLIC PANEL
ALT: 17 U/L (ref 0–44)
AST: 22 U/L (ref 15–41)
Albumin: 4 g/dL (ref 3.5–5.0)
Alkaline Phosphatase: 40 U/L (ref 38–126)
Anion gap: 10 (ref 5–15)
BUN: 20 mg/dL (ref 8–23)
CO2: 24 mmol/L (ref 22–32)
Calcium: 8.7 mg/dL — ABNORMAL LOW (ref 8.9–10.3)
Chloride: 104 mmol/L (ref 98–111)
Creatinine, Ser: 0.73 mg/dL (ref 0.61–1.24)
GFR calc Af Amer: >60 mL/min (ref >60)
GFR calc non Af Amer: >60 mL/min (ref >60)
Glucose, Bld: 108 mg/dL — ABNORMAL HIGH (ref 70–99)
Potassium: 3.7 mmol/L (ref 3.5–5.1)
Sodium: 138 mmol/L (ref 135–145)
Total Bilirubin: 1.2 mg/dL (ref 0.3–1.2)
Total Protein: 6.6 g/dL (ref 6.5–8.1)

## 2018-12-11 LAB — CBC
HCT: 36.6 % — ABNORMAL LOW (ref 39.0–52.0)
Hemoglobin: 12.3 g/dL — ABNORMAL LOW (ref 13.0–17.0)
MCH: 31.1 pg (ref 26.0–34.0)
MCHC: 33.6 g/dL (ref 30.0–36.0)
MCV: 92.7 fL (ref 80.0–100.0)
Platelets: 204 10*3/uL (ref 150–400)
RBC: 3.95 MIL/uL — ABNORMAL LOW (ref 4.22–5.81)
RDW: 12.8 % (ref 11.5–15.5)
WBC: 8.6 10*3/uL (ref 4.0–10.5)
nRBC: 0 % (ref 0.0–0.2)

## 2018-12-11 LAB — HIV ANTIBODY (ROUTINE TESTING W REFLEX): HIV Screen 4th Generation wRfx: NONREACTIVE

## 2018-12-11 MED ORDER — BUPIVACAINE HCL (PF) 0.5 % IJ SOLN
10.0000 mL | Freq: Once | INTRAMUSCULAR | Status: AC
  Administered 2018-12-11: 15:00:00 10 mL
  Filled 2018-12-11: qty 10

## 2018-12-11 MED ORDER — TRIAMCINOLONE ACETONIDE 40 MG/ML IJ SUSP
40.0000 mg | Freq: Once | INTRAMUSCULAR | Status: AC
  Administered 2018-12-11: 15:00:00 40 mg via INTRA_ARTICULAR
  Filled 2018-12-11: qty 1

## 2018-12-11 MED ORDER — OXYCODONE-ACETAMINOPHEN 5-325 MG PO TABS
1.0000 | ORAL_TABLET | Freq: Four times a day (QID) | ORAL | Status: DC | PRN
  Administered 2018-12-11 – 2018-12-14 (×8): 1 via ORAL
  Filled 2018-12-11 (×8): qty 1

## 2018-12-11 MED ORDER — LIDOCAINE-EPINEPHRINE 1 %-1:100000 IJ SOLN
10.0000 mL | Freq: Once | INTRAMUSCULAR | Status: AC
  Administered 2018-12-11: 15:00:00 10 mL
  Filled 2018-12-11: qty 10

## 2018-12-11 MED ORDER — INFLUENZA VAC A&B SA ADJ QUAD 0.5 ML IM PRSY
0.5000 mL | PREFILLED_SYRINGE | Freq: Once | INTRAMUSCULAR | Status: AC
  Administered 2018-12-11: 0.5 mL via INTRAMUSCULAR
  Filled 2018-12-11: qty 0.5

## 2018-12-11 NOTE — Consult Note (Addendum)
ORTHOPAEDIC CONSULTATION  REQUESTING PHYSICIAN: Enedina Finner, MD  Chief Complaint:   Possible L radial neck fracture  History of Present Illness: Randall [name] is a 68 y.o. male who had a fall off scaffolding ~6-8 feet high yesterday. The patient noted immediate L heel pain and difficulty with ambulation. X-rays in ED showed a L calcaneus fracture, and this is being managed by Dr. Ether Griffins with Podiatry. I was consulted to evaluate his L elbow. Imaging in the ED suggested possible L radial neck fracture. The patient states that he is not having any pain in his L elbow, and that his hand actually hurts more. He is able to range his elbow without pain. He does not recall any swelling about the elbow after his injury. He has been in a sling and instructed to be NWB and cannot utilize crutches due to this.    Of note, I have also evaluated him in the office previously for a R rotator cuff tear. My offices had been trying to contact the patient to set up a follow up appointment to discuss options for this. He states that his pain in his R shoulder is what bothers him the most at this point in time. It is rated a 7/10 in severity. Pain is worse with overhead motions and improved at rest. It is located over the lateral aspect of the shoulder and is a sharp, stabbing type pain.   History reviewed. No pertinent past medical history. History reviewed. No pertinent surgical history. Social History   Socioeconomic History  . Marital status: Widowed    Spouse name: Not on file  . Number of children: Not on file  . Years of education: Not on file  . Highest education level: Not on file  Occupational History  . Not on file  Social Needs  . Financial resource strain: Not on file  . Food insecurity    Worry: Not on file    Inability: Not on file  . Transportation needs    Medical: Not on file    Non-medical: Not on file  Tobacco Use  .  Smoking status: Former Games developer  . Smokeless tobacco: Never Used  Substance and Sexual Activity  . Alcohol use: Never    Frequency: Never  . Drug use: Never  . Sexual activity: Not on file  Lifestyle  . Physical activity    Days per week: Not on file    Minutes per session: Not on file  . Stress: Not on file  Relationships  . Social Musician on phone: Not on file    Gets together: Not on file    Attends religious service: Not on file    Active member of club or organization: Not on file    Attends meetings of clubs or organizations: Not on file    Relationship status: Not on file  Other Topics Concern  . Not on file  Social History Narrative  . Not on file   No family history on file. Allergies  Allergen Reactions  . Quetiapine Other (See Comments)    Suicidal thoughts   Prior to Admission medications   Medication Sig Start Date End Date Taking? Authorizing Provider  naproxen (NAPROSYN) 500 MG tablet Take 1 tablet (500 mg total) by mouth 2 (two) times daily with a meal. Patient not taking: Reported on 12/10/2018 09/15/18   Tommi Rumps, PA-C   Recent Labs    12/10/18 1738 12/11/18 0604  WBC 13.3* 8.6  HGB 13.3 12.3*  HCT 39.2 36.6*  PLT 207 204  K 3.8 3.7  CL 105 104  CO2 25 24  BUN 17 20  CREATININE 0.90 0.73  GLUCOSE 102* 108*  CALCIUM 9.1 8.7*   Dg Chest 2 View  Result Date: 12/10/2018 CLINICAL DATA:  Fall EXAM: CHEST - 2 VIEW COMPARISON:  01/31/2013 FINDINGS: The heart size and mediastinal contours are within normal limits. Both lungs are clear. Disc degenerative disease of the thoracic spine. Nonacute fracture deformities of the posterior left ribs. IMPRESSION: No acute abnormality of the lungs. Electronically Signed   By: Lauralyn PrimesAlex  Bibbey M.D.   On: 12/10/2018 13:16   Dg Elbow Complete Left  Result Date: 12/10/2018 CLINICAL DATA:  Fall EXAM: LEFT ELBOW - COMPLETE 3+ VIEW COMPARISON:  Same day forearm radiographs FINDINGS: There is a subtle,  angulated fracture of the left radial neck. Large elbow joint effusion. Soft tissues are unremarkable. IMPRESSION: There is a subtle, angulated fracture of the left radial neck, in keeping with suspicion on prior forearm radiographs. Large elbow joint effusion. Electronically Signed   By: Lauralyn PrimesAlex  Bibbey M.D.   On: 12/10/2018 14:10   Dg Forearm Left  Result Date: 12/10/2018 CLINICAL DATA:  Fall EXAM: LEFT FOREARM - 2 VIEW COMPARISON:  None. FINDINGS: No definite fracture or dislocation of the left forearm. There may be subtle angulation of the left radial head and neck best seen on frontal view. Suspect elbow joint effusion. Included joint spaces are preserved. Intravenous catheter over the ventral soft tissues of the forearm. IMPRESSION: No definite fracture or dislocation of the left forearm. There may be subtle angulation of the left radial head and neck best seen on frontal view. Suspect elbow joint effusion. Correlate for acute point tenderness and consider dedicated elbow radiographs if indicated by clinical suspicion. Electronically Signed   By: Lauralyn PrimesAlex  Bibbey M.D.   On: 12/10/2018 13:20   Dg Forearm Right  Result Date: 12/10/2018 CLINICAL DATA:  Fall EXAM: RIGHT HAND - COMPLETE 3+ VIEW; RIGHT FOREARM - 2 VIEW COMPARISON:  None. FINDINGS: No fracture or dislocation of the right hand or right forearm. Joint spaces are well preserved. Superficial radiopaque debris about the distal digits. Soft tissues are otherwise unremarkable. IMPRESSION: No fracture or dislocation of the right hand or right forearm. Electronically Signed   By: Lauralyn PrimesAlex  Bibbey M.D.   On: 12/10/2018 13:18   Dg Tibia/fibula Left  Result Date: 12/10/2018 CLINICAL DATA:  Fall EXAM: LEFT ANKLE COMPLETE - 3+ VIEW; LEFT TIBIA AND FIBULA - 2 VIEW COMPARISON:  None. FINDINGS: No fracture or dislocation of the left tibia or fibula. Partially imaged knee is intact. There are comminuted, displaced fractures of the body of the left calcaneus,  involving the subtalar joint. The ankle mortise proper and talus are intact. Soft tissue edema about the ankle and heel. IMPRESSION: 1. No fracture or dislocation of the left tibia or fibula. 2. There are comminuted, displaced fractures of the body of the left calcaneus, involving the subtalar joint. The ankle mortise proper and talus are intact. Soft tissue edema about the ankle and heel. Electronically Signed   By: Lauralyn PrimesAlex  Bibbey M.D.   On: 12/10/2018 13:23   Dg Ankle Complete Left  Result Date: 12/10/2018 CLINICAL DATA:  Fall EXAM: LEFT ANKLE COMPLETE - 3+ VIEW; LEFT TIBIA AND FIBULA - 2 VIEW COMPARISON:  None. FINDINGS: No fracture or dislocation of the left tibia or fibula. Partially imaged knee is intact. There are comminuted, displaced fractures of  the body of the left calcaneus, involving the subtalar joint. The ankle mortise proper and talus are intact. Soft tissue edema about the ankle and heel. IMPRESSION: 1. No fracture or dislocation of the left tibia or fibula. 2. There are comminuted, displaced fractures of the body of the left calcaneus, involving the subtalar joint. The ankle mortise proper and talus are intact. Soft tissue edema about the ankle and heel. Electronically Signed   By: Eddie Candle M.D.   On: 12/10/2018 13:23   Ct Head Wo Contrast  Result Date: 12/10/2018 CLINICAL DATA:  Fall 10 free EXAM: CT HEAD WITHOUT CONTRAST CT CERVICAL SPINE WITHOUT CONTRAST TECHNIQUE: Multidetector CT imaging of the head and cervical spine was performed following the standard protocol without intravenous contrast. Multiplanar CT image reconstructions of the cervical spine were also generated. COMPARISON:  None. FINDINGS: CT HEAD FINDINGS Brain: No acute intracranial abnormality. Specifically, no hemorrhage, hydrocephalus, mass lesion, acute infarction, or significant intracranial injury. Vascular: No hyperdense vessel or unexpected calcification. Skull: No acute calvarial abnormality. Sinuses/Orbits:  Visualized paranasal sinuses and mastoids clear. Orbital soft tissues unremarkable. Other: None CT CERVICAL SPINE FINDINGS Alignment: Normal Skull base and vertebrae: No acute fracture. No primary bone lesion or focal pathologic process. Soft tissues and spinal canal: No prevertebral fluid or swelling. No visible canal hematoma. Disc levels: Degenerative spurring anteriorly from C4-5 through C6-7. Disc spaces maintained. Upper chest: Negative Other: None IMPRESSION: No acute intracranial abnormality. No acute bony abnormality in the cervical spine. Electronically Signed   By: Rolm Baptise M.D.   On: 12/10/2018 11:52   Ct Cervical Spine Wo Contrast  Result Date: 12/10/2018 CLINICAL DATA:  Fall 10 free EXAM: CT HEAD WITHOUT CONTRAST CT CERVICAL SPINE WITHOUT CONTRAST TECHNIQUE: Multidetector CT imaging of the head and cervical spine was performed following the standard protocol without intravenous contrast. Multiplanar CT image reconstructions of the cervical spine were also generated. COMPARISON:  None. FINDINGS: CT HEAD FINDINGS Brain: No acute intracranial abnormality. Specifically, no hemorrhage, hydrocephalus, mass lesion, acute infarction, or significant intracranial injury. Vascular: No hyperdense vessel or unexpected calcification. Skull: No acute calvarial abnormality. Sinuses/Orbits: Visualized paranasal sinuses and mastoids clear. Orbital soft tissues unremarkable. Other: None CT CERVICAL SPINE FINDINGS Alignment: Normal Skull base and vertebrae: No acute fracture. No primary bone lesion or focal pathologic process. Soft tissues and spinal canal: No prevertebral fluid or swelling. No visible canal hematoma. Disc levels: Degenerative spurring anteriorly from C4-5 through C6-7. Disc spaces maintained. Upper chest: Negative Other: None IMPRESSION: No acute intracranial abnormality. No acute bony abnormality in the cervical spine. Electronically Signed   By: Rolm Baptise M.D.   On: 12/10/2018 11:52   Ct  Thoracic Spine Wo Contrast  Result Date: 12/10/2018 CLINICAL DATA:  Status post fall, fell from 6-8 scaffolding onto concrete EXAM: CT THORACIC AND LUMBAR SPINE WITHOUT CONTRAST TECHNIQUE: Multidetector CT imaging of the thoracic and lumbar spine was performed without contrast. Multiplanar CT image reconstructions were also generated. COMPARISON:  None. FINDINGS: CT THORACIC SPINE FINDINGS Alignment: Normal. Vertebrae: No acute fracture or focal pathologic process. Paraspinal and other soft tissues: No acute paraspinal abnormality. Thoracic aortic atherosclerosis. Disc levels: Disc spaces are relatively well maintained. No foraminal or central canal stenosis. Anterior bridging osteophytes from T5 through T12 as can be seen with diffuse idiopathic skeletal hyperostosis. CT LUMBAR SPINE FINDINGS Segmentation: 5 lumbar type vertebrae. Alignment: Normal. Vertebrae: No acute fracture or focal pathologic process. Paraspinal and other soft tissues: No acute paraspinal abnormality. Abdominal aortic atherosclerosis. Other:  Mild osteoarthritis of bilateral sacroiliac joints. Disc levels: Disc spaces are maintained. Mild broad-based disc bulge at L3-4 with mild bilateral facet arthropathy. Mild broad-based disc bulge at L4-5 with mild bilateral facet arthropathy. Mild broad-based disc bulge at L5-S1 with bilateral facet arthropathy. No foraminal stenosis of the lumbar spine. No central canal stenosis of the lumbar spine. IMPRESSION: CT THORACIC SPINE IMPRESSION 1.  No acute osseous injury of the thoracic spine. CT LUMBAR SPINE IMPRESSION 1.  No acute osseous injury of the lumbar spine. 2.  Aortic Atherosclerosis (ICD10-I70.0). Electronically Signed   By: Elige Ko   On: 12/10/2018 13:56   Ct Lumbar Spine Wo Contrast  Result Date: 12/10/2018 CLINICAL DATA:  Status post fall, fell from 6-8 scaffolding onto concrete EXAM: CT THORACIC AND LUMBAR SPINE WITHOUT CONTRAST TECHNIQUE: Multidetector CT imaging of the thoracic  and lumbar spine was performed without contrast. Multiplanar CT image reconstructions were also generated. COMPARISON:  None. FINDINGS: CT THORACIC SPINE FINDINGS Alignment: Normal. Vertebrae: No acute fracture or focal pathologic process. Paraspinal and other soft tissues: No acute paraspinal abnormality. Thoracic aortic atherosclerosis. Disc levels: Disc spaces are relatively well maintained. No foraminal or central canal stenosis. Anterior bridging osteophytes from T5 through T12 as can be seen with diffuse idiopathic skeletal hyperostosis. CT LUMBAR SPINE FINDINGS Segmentation: 5 lumbar type vertebrae. Alignment: Normal. Vertebrae: No acute fracture or focal pathologic process. Paraspinal and other soft tissues: No acute paraspinal abnormality. Abdominal aortic atherosclerosis. Other: Mild osteoarthritis of bilateral sacroiliac joints. Disc levels: Disc spaces are maintained. Mild broad-based disc bulge at L3-4 with mild bilateral facet arthropathy. Mild broad-based disc bulge at L4-5 with mild bilateral facet arthropathy. Mild broad-based disc bulge at L5-S1 with bilateral facet arthropathy. No foraminal stenosis of the lumbar spine. No central canal stenosis of the lumbar spine. IMPRESSION: CT THORACIC SPINE IMPRESSION 1.  No acute osseous injury of the thoracic spine. CT LUMBAR SPINE IMPRESSION 1.  No acute osseous injury of the lumbar spine. 2.  Aortic Atherosclerosis (ICD10-I70.0). Electronically Signed   By: Elige Ko   On: 12/10/2018 13:56   Ct Ankle Left Wo Contrast  Result Date: 12/10/2018 CLINICAL DATA:  Status post fall.  Comminuted calcaneal fracture. EXAM: CT OF THE LEFT ANKLE WITHOUT CONTRAST TECHNIQUE: Multidetector CT imaging of the left ankle was performed according to the standard protocol. Multiplanar CT image reconstructions were also generated. COMPARISON:  Radiographs same date. FINDINGS: Bones/Joint/Cartilage There is a comminuted, intra-articular compression fracture of the  calcaneus. This fracture has a component involving the posterior subtalar joint which is depressed by up to 3 mm. There is a nondisplaced component extending into the mid subtalar facet. There is a mildly displaced component extending into the lateral aspect of the calcaneocuboid articulation. The cortex of the calcaneal body laterally is moderately displaced without peroneal tendon entrapment. Small fracture fragments are also present along the posterior process of the talus, best seen on the sagittal images. The talus otherwise appears intact. No other tarsal bone fractures are identified. The distal tibia and fibula are intact. Well corticated ossicles are present along the distal fibular tip. Ligaments Suboptimally assessed by CT. Muscles and Tendons No tendon rupture or entrapment identified. There is a tiny type 1 accessory navicular. There is also prominent os peroneum. Soft tissues Soft tissue swelling in the ankle and hindfoot without focal fluid collection. IMPRESSION: 1. Comminuted intra-articular compression fracture of the calcaneus as described, demonstrating a centrolateral depression configuration. There are mildly displaced components involving the posterior facet of  the subtalar joint and the calcaneocuboid articulation. 2. No other significant tarsal bone fractures. Possible tiny avulsion fractures from the posterior process of the talus. 3. No evidence of ankle tendon entrapment or rupture. Electronically Signed   By: Carey Bullocks M.D.   On: 12/10/2018 16:41   Dg Hand Complete Left  Result Date: 12/10/2018 CLINICAL DATA:  Recent fall from scaffolding with hand pain, initial encounter EXAM: LEFT HAND - COMPLETE 3+ VIEW COMPARISON:  None. FINDINGS: No acute fracture is noted. Deformity at the base of the fifth metacarpal is noted consistent with prior healed fracture. No soft tissue abnormality is seen. IMPRESSION: No acute abnormality noted. Electronically Signed   By: Alcide Clever M.D.    On: 12/10/2018 13:16   Dg Hand Complete Right  Result Date: 12/10/2018 CLINICAL DATA:  Fall EXAM: RIGHT HAND - COMPLETE 3+ VIEW; RIGHT FOREARM - 2 VIEW COMPARISON:  None. FINDINGS: No fracture or dislocation of the right hand or right forearm. Joint spaces are well preserved. Superficial radiopaque debris about the distal digits. Soft tissues are otherwise unremarkable. IMPRESSION: No fracture or dislocation of the right hand or right forearm. Electronically Signed   By: Lauralyn Primes M.D.   On: 12/10/2018 13:18     Positive ROS: All other systems have been reviewed and were otherwise negative with the exception of those mentioned in the HPI and as above.  Physical Exam: BP 110/72 (BP Location: Right Arm)   Pulse 77   Temp 98.4 F (36.9 C) (Oral)   Resp 18   Ht 5\' 7"  (1.702 m)   Wt 79.4 kg   SpO2 98%   BMI 27.41 kg/m  General:  Alert, no acute distress Psychiatric:  Patient is competent for consent with normal mood and affect   Cardiovascular:  No pedal edema, regular rate and rhythm Respiratory:  No wheezing, non-labored breathing GI:  Abdomen is soft and non-tender Skin:  No lesions in the area of chief complaint, no erythema Neurologic:  Sensation intact distally, CN grossly intact Lymphatic:  No axillary or cervical lymphadenopathy  Orthopedic Exam:  LUE: +ain/pin/u motor SILT r/u/m/ax +rad pulse RoM elbow: 0-130 flex/ext, 70 sup/60 pro all without pain No TTP about the elbow including the radial head neck/lateral epicondyle/olecranon process No notable swelling about elbow.    RUE: RUE: +ain/pin/u motor SILT r/u/m/ax +rad pulse Weakness to supraspinatus testing, ROM grossly symmetrical to contralateral side   X-rays:  No clear evidence of radial neck fracture. No notable joint effusion on radiographs  Assessment/Plan: KERY BATZEL is a 68 y.o. male with some previous suspicion of L radial neck fracture, but on clinical exam, there is no evidence to suggest  injury to this region.   1. Recommend WBAT on LUE. No need for sling. Can resume all activities on LUE as tolerated.   2. Regarding his RUE and rotator cuff tear, we discussed that surgical intervention may be beneficial to repair the full-thickness rotator cuff tear, but given his calcaneus fracture, that may take priority. Patient in agreement. We discussed that we could perform corticosteroid injection to RUE in the hospital to help with pain for the interim. We will plan for this later today.   3. Follow up with me as an outpatient.    ADDENDUM: Patient given Right shoulder subacromial bursa corticosteroid injection.  Please see procedure note below.   Procedure Note - Subacromial Cortisone Injection - Right I reviewed with the patient the procedure of R subacromial injection and discussed the  risks, benefits, and alternative treatments. We discussed the potential risks of infection, allergic reaction, increased pain, incomplete relief or temporary relief of symptoms, alterations of blood glucose levels requiring careful monitoring and treatment as indicated, tendon, ligament or articular cartilage rupture or degeneration, nerve injury, and/or skin depigmentation. Verbal consent was obtained.  A time-out was conducted to verify correct patient identity, procedure to be performed, and correct side and site. The skin was marked and then prepped in usual sterile fashion. I then injected a mixture of 1 mL of Kenalog , 2 mL of 1% Lidocaine, and 2 mL of 0.5% Ropivicaine into the subacromial space using a 22g needle without complication. The patient tolerated the procedure well and noted significant relief of pain after the procedure. Aftercare was discussed.      Randall [name]   12/11/2018 8:42 AM

## 2018-12-11 NOTE — Consult Note (Signed)
Consult received.  Images reviewed.  Will d/w pt this pm.

## 2018-12-11 NOTE — TOC Initial Note (Signed)
Transition of Care [facility]) - Initial/Assessment Note    Patient Details  Name: Randall [name] MRN: [id number] Date of Birth: [birth year]  [facility] CM/SW Contact:    Su Hilt, RN Phone Number: 12/11/2018, 9:58 AM  Clinical Narrative:                 Met with the patient to discuss DC plan and needs He stated he did not know if this would be filed with workmans comp or not but to go ahead and arrange what I needed to for DME etc with his insurance.  He is still talking to his boss about the workman's comp side of it. He is to work with Physical Therapy today and will determine what the recommendation is. Will continue to monitor  Expected Discharge Plan: Home/Self Care Barriers to Discharge: Continued Medical Work up   Patient Goals and CMS Choice Patient states their goals for this hospitalization and ongoing recovery are:: go home      Expected Discharge Plan and Services Expected Discharge Plan: Home/Self Care   Discharge Planning Services: CM Consult   Living arrangements for the past 2 months: Single Family Home                 DME Arranged: Crutches DME Agency: [employer] Date DME Agency Contacted: 12/11/18 Time DME Agency Contacted: [phone] Representative spoke with at DME Agency: [facility] Arranged: NA          Prior Living Arrangements/Services Living arrangements for the past 2 months: Single Family Home Lives with:: Self Patient language and need for interpreter reviewed:: Yes Do you feel safe going back to the place where you live?: Yes      Need for Family Participation in Patient Care: No (Comment) Care giver support system in place?: Yes (comment)   Criminal Activity/Legal Involvement Pertinent to Current Situation/Hospitalization: No - Comment as needed  Activities of Daily Living Home Assistive Devices/Equipment: Dentures (specify type), Eyeglasses ADL Screening (condition at time of admission) Patient's cognitive ability  adequate to safely complete daily activities?: Yes Is the patient deaf or have difficulty hearing?: No Does the patient have difficulty seeing, even when wearing glasses/contacts?: No Does the patient have difficulty concentrating, remembering, or making decisions?: No Patient able to express need for assistance with ADLs?: Yes Does the patient have difficulty dressing or bathing?: Yes Independently performs ADLs?: Yes (appropriate for developmental age) Does the patient have difficulty walking or climbing stairs?: No Weakness of Legs: Left(d/t recent injury) Weakness of Arms/Hands: Left(d/t recent injury)  Permission Sought/Granted   Permission granted to share information with : Yes, Verbal Permission Granted              Emotional Assessment Appearance:: Appears stated age Attitude/Demeanor/Rapport: Engaged Affect (typically observed): Appropriate Orientation: : Oriented to Self, Oriented to Place, Oriented to  Time, Oriented to Situation Alcohol / Substance Use: Not Applicable Psych Involvement: No (comment)  Admission diagnosis:  Fall, initial encounter [W19.XXXA] Patient Active Problem List   Diagnosis Date Noted  . Calcaneal fracture 12/10/2018   PCP:  Donnie Coffin, MD Pharmacy:   [facility] [place], [address] [place] [address] Phone: [phone] Fax: [phone]  [facility] [address] ([address]), [place] - [place]) [place] [address] Phone: [phone] Fax: [phone]     Social Determinants of Health (SDOH) Interventions  Readmission Risk Interventions No flowsheet data found.

## 2018-12-11 NOTE — Progress Notes (Signed)
[facility] [place] [facility]   PATIENT NAME: Randall [name]    MR#:  [id number]  DATE OF BIRTH:  [birth year]  SUBJECTIVE:   Patient here after falling from scaffold while at work. He has fracture calcaneal and elbow. Complains of pain. Awaiting PT. He was seen by orthopedic yesterday. REVIEW OF SYSTEMS:   Review of Systems  Constitutional: Negative for chills, fever and weight loss.  HENT: Negative for ear discharge, ear pain and nosebleeds.   Eyes: Negative for blurred vision, pain and discharge.  Respiratory: Negative for sputum production, shortness of breath, wheezing and stridor.   Cardiovascular: Negative for chest pain, palpitations, orthopnea and PND.  Gastrointestinal: Negative for abdominal pain, diarrhea, nausea and vomiting.  Genitourinary: Negative for frequency and urgency.  Musculoskeletal: Positive for joint pain. Negative for back pain.  Neurological: Positive for weakness. Negative for sensory change, speech change and focal weakness.  Psychiatric/Behavioral: Negative for depression and hallucinations. The patient is not nervous/anxious.    Tolerating Diet: yes Tolerating PT: pending  DRUG ALLERGIES:   Allergies  Allergen Reactions  . Quetiapine Other (See Comments)    Suicidal thoughts    VITALS:  Blood pressure 110/72, pulse 77, temperature 98.4 F (36.9 C), temperature source Oral, resp. rate 18, height 5\' 7"  (1.702 m), weight 79.4 kg, SpO2 98 %.  PHYSICAL EXAMINATION:   Physical Exam  GENERAL:  68 y.o.-year-old patient lying in the bed with no acute distress.  EYES: Pupils equal, round, reactive to light and accommodation. No scleral icterus. Extraocular muscles intact.  HEENT: Head atraumatic, normocephalic. Oropharynx and nasopharynx clear.  NECK:  Supple, no jugular venous distention. No thyroid enlargement, no tenderness.  LUNGS: Normal breath sounds bilaterally, no wheezing, rales, rhonchi. No use of accessory  muscles of respiration.  CARDIOVASCULAR: S1, S2 normal. No murmurs, rubs, or gallops.  ABDOMEN: Soft, nontender, nondistended. Bowel sounds present. No organomegaly or mass.  EXTREMITIES: No cyanosis, clubbing or edema b/l.   Left lower extremity cost present. Left upper extremity sling present. NEUROLOGIC: Cranial nerves II through XII are intact. No focal Motor or sensory deficits b/l.   PSYCHIATRIC:  patient is alert and oriented x 3.  SKIN: No obvious rash, lesion, or ulcer.   LABORATORY PANEL:  CBC Recent Labs  Lab 12/11/18 0604  WBC 8.6  HGB 12.3*  HCT 36.6*  PLT 204    Chemistries  Recent Labs  Lab 12/11/18 0604  NA 138  K 3.7  CL 104  CO2 24  GLUCOSE 108*  BUN 20  CREATININE 0.73  CALCIUM 8.7*  AST 22  ALT 17  ALKPHOS 40  BILITOT 1.2   Cardiac Enzymes No results for input(s): TROPONINI in the last 168 hours. RADIOLOGY:  Dg Chest 2 View  Result Date: 12/10/2018 CLINICAL DATA:  Fall EXAM: CHEST - 2 VIEW COMPARISON:  01/31/2013 FINDINGS: The heart size and mediastinal contours are within normal limits. Both lungs are clear. Disc degenerative disease of the thoracic spine. Nonacute fracture deformities of the posterior left ribs. IMPRESSION: No acute abnormality of the lungs. Electronically Signed   By: 02/02/2013 M.D.   On: 12/10/2018 13:16   Dg Elbow Complete Left  Result Date: 12/10/2018 CLINICAL DATA:  Fall EXAM: LEFT ELBOW - COMPLETE 3+ VIEW COMPARISON:  Same day forearm radiographs FINDINGS: There is a subtle, angulated fracture of the left radial neck. Large elbow joint effusion. Soft tissues are unremarkable. IMPRESSION: There is a subtle, angulated fracture of the left radial  neck, in keeping with suspicion on prior forearm radiographs. Large elbow joint effusion. Electronically Signed   By: Lauralyn Primes M.D.   On: 12/10/2018 14:10   Dg Forearm Left  Result Date: 12/10/2018 CLINICAL DATA:  Fall EXAM: LEFT FOREARM - 2 VIEW COMPARISON:  None. FINDINGS: No  definite fracture or dislocation of the left forearm. There may be subtle angulation of the left radial head and neck best seen on frontal view. Suspect elbow joint effusion. Included joint spaces are preserved. Intravenous catheter over the ventral soft tissues of the forearm. IMPRESSION: No definite fracture or dislocation of the left forearm. There may be subtle angulation of the left radial head and neck best seen on frontal view. Suspect elbow joint effusion. Correlate for acute point tenderness and consider dedicated elbow radiographs if indicated by clinical suspicion. Electronically Signed   By: Lauralyn Primes M.D.   On: 12/10/2018 13:20   Dg Forearm Right  Result Date: 12/10/2018 CLINICAL DATA:  Fall EXAM: RIGHT HAND - COMPLETE 3+ VIEW; RIGHT FOREARM - 2 VIEW COMPARISON:  None. FINDINGS: No fracture or dislocation of the right hand or right forearm. Joint spaces are well preserved. Superficial radiopaque debris about the distal digits. Soft tissues are otherwise unremarkable. IMPRESSION: No fracture or dislocation of the right hand or right forearm. Electronically Signed   By: Lauralyn Primes M.D.   On: 12/10/2018 13:18   Dg Tibia/fibula Left  Result Date: 12/10/2018 CLINICAL DATA:  Fall EXAM: LEFT ANKLE COMPLETE - 3+ VIEW; LEFT TIBIA AND FIBULA - 2 VIEW COMPARISON:  None. FINDINGS: No fracture or dislocation of the left tibia or fibula. Partially imaged knee is intact. There are comminuted, displaced fractures of the body of the left calcaneus, involving the subtalar joint. The ankle mortise proper and talus are intact. Soft tissue edema about the ankle and heel. IMPRESSION: 1. No fracture or dislocation of the left tibia or fibula. 2. There are comminuted, displaced fractures of the body of the left calcaneus, involving the subtalar joint. The ankle mortise proper and talus are intact. Soft tissue edema about the ankle and heel. Electronically Signed   By: Lauralyn Primes M.D.   On: 12/10/2018 13:23    Dg Ankle Complete Left  Result Date: 12/10/2018 CLINICAL DATA:  Fall EXAM: LEFT ANKLE COMPLETE - 3+ VIEW; LEFT TIBIA AND FIBULA - 2 VIEW COMPARISON:  None. FINDINGS: No fracture or dislocation of the left tibia or fibula. Partially imaged knee is intact. There are comminuted, displaced fractures of the body of the left calcaneus, involving the subtalar joint. The ankle mortise proper and talus are intact. Soft tissue edema about the ankle and heel. IMPRESSION: 1. No fracture or dislocation of the left tibia or fibula. 2. There are comminuted, displaced fractures of the body of the left calcaneus, involving the subtalar joint. The ankle mortise proper and talus are intact. Soft tissue edema about the ankle and heel. Electronically Signed   By: Lauralyn Primes M.D.   On: 12/10/2018 13:23   Ct Head Wo Contrast  Result Date: 12/10/2018 CLINICAL DATA:  Fall 10 free EXAM: CT HEAD WITHOUT CONTRAST CT CERVICAL SPINE WITHOUT CONTRAST TECHNIQUE: Multidetector CT imaging of the head and cervical spine was performed following the standard protocol without intravenous contrast. Multiplanar CT image reconstructions of the cervical spine were also generated. COMPARISON:  None. FINDINGS: CT HEAD FINDINGS Brain: No acute intracranial abnormality. Specifically, no hemorrhage, hydrocephalus, mass lesion, acute infarction, or significant intracranial injury. Vascular: No hyperdense vessel or  unexpected calcification. Skull: No acute calvarial abnormality. Sinuses/Orbits: Visualized paranasal sinuses and mastoids clear. Orbital soft tissues unremarkable. Other: None CT CERVICAL SPINE FINDINGS Alignment: Normal Skull base and vertebrae: No acute fracture. No primary bone lesion or focal pathologic process. Soft tissues and spinal canal: No prevertebral fluid or swelling. No visible canal hematoma. Disc levels: Degenerative spurring anteriorly from C4-5 through C6-7. Disc spaces maintained. Upper chest: Negative Other: None  IMPRESSION: No acute intracranial abnormality. No acute bony abnormality in the cervical spine. Electronically Signed   By: Rolm Baptise M.D.   On: 12/10/2018 11:52   Ct Cervical Spine Wo Contrast  Result Date: 12/10/2018 CLINICAL DATA:  Fall 10 free EXAM: CT HEAD WITHOUT CONTRAST CT CERVICAL SPINE WITHOUT CONTRAST TECHNIQUE: Multidetector CT imaging of the head and cervical spine was performed following the standard protocol without intravenous contrast. Multiplanar CT image reconstructions of the cervical spine were also generated. COMPARISON:  None. FINDINGS: CT HEAD FINDINGS Brain: No acute intracranial abnormality. Specifically, no hemorrhage, hydrocephalus, mass lesion, acute infarction, or significant intracranial injury. Vascular: No hyperdense vessel or unexpected calcification. Skull: No acute calvarial abnormality. Sinuses/Orbits: Visualized paranasal sinuses and mastoids clear. Orbital soft tissues unremarkable. Other: None CT CERVICAL SPINE FINDINGS Alignment: Normal Skull base and vertebrae: No acute fracture. No primary bone lesion or focal pathologic process. Soft tissues and spinal canal: No prevertebral fluid or swelling. No visible canal hematoma. Disc levels: Degenerative spurring anteriorly from C4-5 through C6-7. Disc spaces maintained. Upper chest: Negative Other: None IMPRESSION: No acute intracranial abnormality. No acute bony abnormality in the cervical spine. Electronically Signed   By: Rolm Baptise M.D.   On: 12/10/2018 11:52   Ct Thoracic Spine Wo Contrast  Result Date: 12/10/2018 CLINICAL DATA:  Status post fall, fell from 6-8 scaffolding onto concrete EXAM: CT THORACIC AND LUMBAR SPINE WITHOUT CONTRAST TECHNIQUE: Multidetector CT imaging of the thoracic and lumbar spine was performed without contrast. Multiplanar CT image reconstructions were also generated. COMPARISON:  None. FINDINGS: CT THORACIC SPINE FINDINGS Alignment: Normal. Vertebrae: No acute fracture or focal  pathologic process. Paraspinal and other soft tissues: No acute paraspinal abnormality. Thoracic aortic atherosclerosis. Disc levels: Disc spaces are relatively well maintained. No foraminal or central canal stenosis. Anterior bridging osteophytes from T5 through T12 as can be seen with diffuse idiopathic skeletal hyperostosis. CT LUMBAR SPINE FINDINGS Segmentation: 5 lumbar type vertebrae. Alignment: Normal. Vertebrae: No acute fracture or focal pathologic process. Paraspinal and other soft tissues: No acute paraspinal abnormality. Abdominal aortic atherosclerosis. Other: Mild osteoarthritis of bilateral sacroiliac joints. Disc levels: Disc spaces are maintained. Mild broad-based disc bulge at L3-4 with mild bilateral facet arthropathy. Mild broad-based disc bulge at L4-5 with mild bilateral facet arthropathy. Mild broad-based disc bulge at L5-S1 with bilateral facet arthropathy. No foraminal stenosis of the lumbar spine. No central canal stenosis of the lumbar spine. IMPRESSION: CT THORACIC SPINE IMPRESSION 1.  No acute osseous injury of the thoracic spine. CT LUMBAR SPINE IMPRESSION 1.  No acute osseous injury of the lumbar spine. 2.  Aortic Atherosclerosis (ICD10-I70.0). Electronically Signed   By: Kathreen Devoid   On: 12/10/2018 13:56   Ct Lumbar Spine Wo Contrast  Result Date: 12/10/2018 CLINICAL DATA:  Status post fall, fell from 6-8 scaffolding onto concrete EXAM: CT THORACIC AND LUMBAR SPINE WITHOUT CONTRAST TECHNIQUE: Multidetector CT imaging of the thoracic and lumbar spine was performed without contrast. Multiplanar CT image reconstructions were also generated. COMPARISON:  None. FINDINGS: CT THORACIC SPINE FINDINGS Alignment: Normal. Vertebrae: No  acute fracture or focal pathologic process. Paraspinal and other soft tissues: No acute paraspinal abnormality. Thoracic aortic atherosclerosis. Disc levels: Disc spaces are relatively well maintained. No foraminal or central canal stenosis. Anterior  bridging osteophytes from T5 through T12 as can be seen with diffuse idiopathic skeletal hyperostosis. CT LUMBAR SPINE FINDINGS Segmentation: 5 lumbar type vertebrae. Alignment: Normal. Vertebrae: No acute fracture or focal pathologic process. Paraspinal and other soft tissues: No acute paraspinal abnormality. Abdominal aortic atherosclerosis. Other: Mild osteoarthritis of bilateral sacroiliac joints. Disc levels: Disc spaces are maintained. Mild broad-based disc bulge at L3-4 with mild bilateral facet arthropathy. Mild broad-based disc bulge at L4-5 with mild bilateral facet arthropathy. Mild broad-based disc bulge at L5-S1 with bilateral facet arthropathy. No foraminal stenosis of the lumbar spine. No central canal stenosis of the lumbar spine. IMPRESSION: CT THORACIC SPINE IMPRESSION 1.  No acute osseous injury of the thoracic spine. CT LUMBAR SPINE IMPRESSION 1.  No acute osseous injury of the lumbar spine. 2.  Aortic Atherosclerosis (ICD10-I70.0). Electronically Signed   By: Elige KoHetal  Kinisha Soper   On: 12/10/2018 13:56   Ct Ankle Left Wo Contrast  Result Date: 12/10/2018 CLINICAL DATA:  Status post fall.  Comminuted calcaneal fracture. EXAM: CT OF THE LEFT ANKLE WITHOUT CONTRAST TECHNIQUE: Multidetector CT imaging of the left ankle was performed according to the standard protocol. Multiplanar CT image reconstructions were also generated. COMPARISON:  Radiographs same date. FINDINGS: Bones/Joint/Cartilage There is a comminuted, intra-articular compression fracture of the calcaneus. This fracture has a component involving the posterior subtalar joint which is depressed by up to 3 mm. There is a nondisplaced component extending into the mid subtalar facet. There is a mildly displaced component extending into the lateral aspect of the calcaneocuboid articulation. The cortex of the calcaneal body laterally is moderately displaced without peroneal tendon entrapment. Small fracture fragments are also present along the  posterior process of the talus, best seen on the sagittal images. The talus otherwise appears intact. No other tarsal bone fractures are identified. The distal tibia and fibula are intact. Well corticated ossicles are present along the distal fibular tip. Ligaments Suboptimally assessed by CT. Muscles and Tendons No tendon rupture or entrapment identified. There is a tiny type 1 accessory navicular. There is also prominent os peroneum. Soft tissues Soft tissue swelling in the ankle and hindfoot without focal fluid collection. IMPRESSION: 1. Comminuted intra-articular compression fracture of the calcaneus as described, demonstrating a centrolateral depression configuration. There are mildly displaced components involving the posterior facet of the subtalar joint and the calcaneocuboid articulation. 2. No other significant tarsal bone fractures. Possible tiny avulsion fractures from the posterior process of the talus. 3. No evidence of ankle tendon entrapment or rupture. Electronically Signed   By: Carey BullocksWilliam  Veazey M.D.   On: 12/10/2018 16:41   Dg Hand Complete Left  Result Date: 12/10/2018 CLINICAL DATA:  Recent fall from scaffolding with hand pain, initial encounter EXAM: LEFT HAND - COMPLETE 3+ VIEW COMPARISON:  None. FINDINGS: No acute fracture is noted. Deformity at the base of the fifth metacarpal is noted consistent with prior healed fracture. No soft tissue abnormality is seen. IMPRESSION: No acute abnormality noted. Electronically Signed   By: Alcide CleverMark  Lukens M.D.   On: 12/10/2018 13:16   Dg Hand Complete Right  Result Date: 12/10/2018 CLINICAL DATA:  Fall EXAM: RIGHT HAND - COMPLETE 3+ VIEW; RIGHT FOREARM - 2 VIEW COMPARISON:  None. FINDINGS: No fracture or dislocation of the right hand or right forearm. Joint spaces are  well preserved. Superficial radiopaque debris about the distal digits. Soft tissues are otherwise unremarkable. IMPRESSION: No fracture or dislocation of the right hand or right forearm.  Electronically Signed   By: Lauralyn Primes M.D.   On: 12/10/2018 13:18   ASSESSMENT AND PLAN:  Loic Hobin  is a 68 y.o. 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  X-rays have revealed left calcaneal fracture and left radial neck fracture  #Acute left foot pain from left calcaneal fracture -Podiatry consult placed to Dr. Ether Griffins -Recommending splint placement and nonweightbearing, not considering surgery at this point of time-- PT to see patient. Follow recommendations -Pain management as needed CT head is negative  #Left radial neck fracture Consult placed to orthopedics Dr. Signa Kell-- no sling required. Patient can do his routine activity.  #Right upper extremity/shoulder rotator cuff tear. Patient will follow-up with Dr. Allena Katz to see if he wants to consider surgical option. This can be done as outpatient. -Pain management as needed  #Leukocytosis probably reactive Patient is afebrile   Case discussed with Care Management/Social Worker. Management plans discussed with the patient  and they are in agreement.  CODE STATUS: full  DVT Prophylaxis: lovenox  TOTAL TIME TAKING CARE OF THIS PATIENT: *30* minutes.  >50% time spent on counselling and coordination of care  POSSIBLE D/C IN *1-2* DAYS, DEPENDING ON CLINICAL CONDITION.  Note: This dictation was prepared with Dragon dictation along with smaller phrase technology. Any transcriptional errors that result from this process are unintentional.  Enedina Finner M.D on 12/11/2018 at 1:55 PM  Between 7am to 6pm - Pager - [phone]  After 6pm go to [email or web address] - Social research officer, government  [facility]  Office  [phone]  CC: Primary care physician; Emogene Morgan, MDPatient ID: [name], male   DOB: [birth year], 68 y.o.   MRN: [id number]

## 2018-12-11 NOTE — Evaluation (Addendum)
Physical Therapy Evaluation Patient Details Name: Randall [name] MRN: [id number] DOB: [birth year] Today's Date: 12/11/2018   History of Present Illness  Pt is a 68 y.o. male presenting to hospital s/p fall 9/29 from scaffold onto L side.  Imaging showing comminuted displaced fx's of body of L calcaneus involving subtalar joint and possible tiny avulsion fx's posterior process of talus; also possible subtle angulated fx L radial neck and large elbow joint effusion.  H/o R shoulder rotator cuff tears.  Currently pt non-op.  Clinical Impression  Prior to hospital admission, pt was independent and working.  Pt lives alone in 1 level home with steps to enter.  Currently pt is modified independent with bed mobility; CGA with transfers (CGA to min assist for transfer to/from knee scooter); and CGA to min assist x40 feet with knee scooter.  D/t B hand and wrist pain R>L (and R forearm pain) pt had difficulty putting weight through UE's to push up with or attempt to use a walker for ambulation so trialed knee scooter instead but pt had difficulty holding onto handle of knee scooter with R hand (pt reports d/t pain and weakness) making use of knee scooter also more challenging.  Currently, anticipate pt would most likely do best with a manual w/c for functional mobility but will need to re-assess pt tomorrow to determine pt's progress and any changes to DME recommendations.  Pt would benefit from skilled PT to address noted impairments and functional limitations (see below for any additional details).  Upon hospital discharge, pt would benefit from STR to improve strength and independence with functional mobility but pt reporting he would prefer to go home with assist of his daughter instead.  CM notified of above (including pt's mobility, assist levels, DME, pain, etc).    Follow Up Recommendations SNF    Equipment Recommendations  3in1 (PT);Wheelchair (measurements PT);Wheelchair cushion (measurements PT)     Recommendations for Other Services OT consult     Precautions / Restrictions Precautions Precautions: Fall Restrictions Weight Bearing Restrictions: Yes LUE Weight Bearing: Weight bearing as tolerated LLE Weight Bearing: Non weight bearing Other Position/Activity Restrictions: Per secure text with Dr. Signa Kell (ortho) 12/11/18: no restriction on R UE from PT standpoint      Mobility  Bed Mobility Overal bed mobility: Modified Independent             General bed mobility comments: Semi-supine to/from sit without any noted difficulties  Transfers Overall transfer level: Needs assistance   Transfers: Sit to/from Stand;Stand Pivot Transfers Sit to Stand: Min guard         General transfer comment: pt able to stand from bed and recliner with single UE support CGA (NWB'ing L LE); able to stand up to walker with CGA and vc's for walker use although during transfer bed to recliner with RW pt gently resting B hands on walker but overall did not use walker much d/t c/o B hand/wrist pain but able to "hop" safely to chair with NWB'ing L LE; max cueing to transfer to/from knee scooter with CGA to min assist (pt initially transferred to sit on knee scooter like sitting on a motorcycle although therapist had just given pt significant cueing on correct way to transfer and how to use it as a knee scooter); CGA stand pivot recliner to bed NWB L LE  Ambulation/Gait             General Gait Details: unable d/t pt unable to bear weight through UE's to  attempt (d/t B hand/wrist pain)  Stairs            Wheelchair Mobility    Modified Rankin (Stroke Patients Only)       Balance Overall balance assessment: Needs assistance Sitting-balance support: No upper extremity supported;Feet unsupported Sitting balance-Leahy Scale: Normal Sitting balance - Comments: steady sitting reaching outside BOS   Standing balance support: Single extremity supported Standing balance-Leahy  Scale: Poor Standing balance comment: pt requiring at least single UE support for static standing balance                             Pertinent Vitals/Pain Pain Assessment: 0-10 Pain Score: 7  Pain Location: R forearm and wrist; L ankle/foot Pain Descriptors / Indicators: Tender;Sore;Constant;Discomfort;Grimacing;Guarding Pain Intervention(s): Limited activity within patient's tolerance;Monitored during session;Repositioned;Patient requesting pain meds-RN notified    Home Living Family/patient expects to be discharged to:: Private residence Living Arrangements: Alone Available Help at Discharge: Family(pt reports daughter able to assist) Type of Home: House Home Access: Stairs to enter   [employer] Corporation[employer]trance Stairs-Number of Steps: 1 step plus 3 steps to enter from front; 5 steps with B railings (unable to reach both) from back Home Layout: One level Home Equipment: Grab bars - tub/shower      Prior Function Level of Independence: Independent               Hand Dominance        Extremity/Trunk Assessment   Upper Extremity Assessment Upper Extremity Assessment: RUE deficits/detail;LUE deficits/detail;Defer to OT evaluation RUE Deficits / Details: fair R hand grip strength (limited d/t R hand pain); at least 3/5 AROM R elbow flexion/extension and shoulder flexion RUE: Unable to fully assess due to pain LUE Deficits / Details: good L hand grip strength (although limited d/t L hand pain); at least 3/5 AROM L elbow flexion/extension and shoulder flexion LUE: Unable to fully assess due to pain    Lower Extremity Assessment Lower Extremity Assessment: Overall WFL for tasks assessed(L ankle/foot not assessed d/t injuries and in splint)    Cervical / Trunk Assessment Cervical / Trunk Assessment: Normal  Communication   Communication: No difficulties  Cognition Arousal/Alertness: Awake/alert Behavior During Therapy: WFL for tasks assessed/performed Overall Cognitive  Status: Within Functional Limits for tasks assessed                                        General Comments General comments (skin integrity, edema, etc.): L ankle/foot splint in place.  Nursing cleared pt for participation in physical therapy.  Pt agreeable to PT session.  Dr. Signa KellSunny Patel cleared PT to see pt after cortisone injection to R shoulder.    Exercises  Transfer and mobility training   Assessment/Plan    PT Assessment Patient needs continued PT services  PT Problem List Decreased strength;Decreased range of motion;Decreased activity tolerance;Decreased balance;Decreased mobility;Decreased knowledge of use of DME;Decreased knowledge of precautions;Pain       PT Treatment Interventions DME instruction;Gait training;Stair training;Functional mobility training;Therapeutic activities;Therapeutic exercise;Balance training;Patient/family education    PT Goals (Current goals can be found in the Care Plan section)  Acute Rehab PT Goals Patient Stated Goal: to have less pain PT Goal Formulation: With patient Time For Goal Achievement: 12/25/18 Potential to Achieve Goals: Good    Frequency 7X/week (Addended)   Barriers to discharge Decreased caregiver support  Co-evaluation               AM-PAC PT "6 Clicks" Mobility  Outcome Measure Help needed turning from your back to your side while in a flat bed without using bedrails?: None Help needed moving from lying on your back to sitting on the side of a flat bed without using bedrails?: None Help needed moving to and from a bed to a chair (including a wheelchair)?: A Little Help needed standing up from a chair using your arms (e.g., wheelchair or bedside chair)?: A Little Help needed to walk in hospital room?: A Lot Help needed climbing 3-5 steps with a railing? : Total 6 Click Score: 17    End of Session Equipment Utilized During Treatment: Gait belt Activity Tolerance: Patient limited by  pain Patient left: in bed;with call bell/phone within reach;with bed alarm set;Other (comment)(L LE elevated in pillow; bed in lowest position) Nurse Communication: Mobility status;Precautions;Weight bearing status;Patient requests pain meds PT Visit Diagnosis: Other abnormalities of gait and mobility (R26.89);Muscle weakness (generalized) (M62.81);History of falling (Z91.81);Difficulty in walking, not elsewhere classified (R26.2);Pain Pain - Right/Left: Left Pain - part of body: Ankle and joints of foot    Time: 3491-7915 PT Time Calculation (min) (ACUTE ONLY): 31 min   Charges:   PT Evaluation $PT Eval Low Complexity: 1 Low PT Treatments $Therapeutic Activity: 8-22 mins       Leitha Bleak, PT 12/11/18, 4:54 PM [phone]

## 2018-12-11 NOTE — Progress Notes (Signed)
PT Cancellation Note  Patient Details Name: Randall [name] MRN: [id number] DOB: Jul 21, 1950   Cancelled Treatment:    Reason Eval/Treat Not Completed: Other (comment).  PT consult received.  Chart reviewed.  Pt sitting up in chair upon PT arrival and pt reports lunch is supposed to be here soon and pt wanted to eat lunch before participating in physical therapy.  Will re-attempt PT session after lunch.  Leitha Bleak, PT 12/11/18, 12:21 PM [phone]

## 2018-12-11 NOTE — Consult Note (Signed)
ORTHOPAEDIC CONSULTATION  REQUESTING PHYSICIAN: Enedina Finner, MD  Chief Complaint: Left foot injury  HPI: Randall [name] is a 68 y.o. male who complains of left calcaneal fracture.  Fell from a scaffolding approximately 6 feet in the air yesterday.  Brought to the ER and x-rays of his left lower extremity showed a calcaneal fracture.  Subsequently CT scan was performed that shows an intra-articular calcaneal fracture.  Admitted for pain management.  As well as physical therapy has been ordered.  Denies any back pain.  Has a history of chronic right shoulder pain and has been seen by orthopedics for both his right upper extremity and left upper extremity.  No surgery has been recommended for his left upper extremity.  History reviewed. No pertinent past medical history. History reviewed. No pertinent surgical history. Social History   Socioeconomic History  . Marital status: Widowed    Spouse name: Not on file  . Number of children: Not on file  . Years of education: Not on file  . Highest education level: Not on file  Occupational History  . Not on file  Social Needs  . Financial resource strain: Not on file  . Food insecurity    Worry: Not on file    Inability: Not on file  . Transportation needs    Medical: Not on file    Non-medical: Not on file  Tobacco Use  . Smoking status: Former Games developer  . Smokeless tobacco: Never Used  Substance and Sexual Activity  . Alcohol use: Never    Frequency: Never  . Drug use: Never  . Sexual activity: Not on file  Lifestyle  . Physical activity    Days per week: Not on file    Minutes per session: Not on file  . Stress: Not on file  Relationships  . Social Musician on phone: Not on file    Gets together: Not on file    Attends religious service: Not on file    Active member of club or organization: Not on file    Attends meetings of clubs or organizations: Not on file    Relationship status: Not on file  Other Topics  Concern  . Not on file  Social History Narrative  . Not on file   No family history on file. Allergies  Allergen Reactions  . Quetiapine Other (See Comments)    Suicidal thoughts   Prior to Admission medications   Medication Sig Start Date End Date Taking? Authorizing Provider  naproxen (NAPROSYN) 500 MG tablet Take 1 tablet (500 mg total) by mouth 2 (two) times daily with a meal. Patient not taking: Reported on 12/10/2018 09/15/18   Tommi Rumps, PA-C   Dg Chest 2 View  Result Date: 12/10/2018 CLINICAL DATA:  Fall EXAM: CHEST - 2 VIEW COMPARISON:  01/31/2013 FINDINGS: The heart size and mediastinal contours are within normal limits. Both lungs are clear. Disc degenerative disease of the thoracic spine. Nonacute fracture deformities of the posterior left ribs. IMPRESSION: No acute abnormality of the lungs. Electronically Signed   By: Lauralyn Primes M.D.   On: 12/10/2018 13:16   Dg Elbow Complete Left  Result Date: 12/10/2018 CLINICAL DATA:  Fall EXAM: LEFT ELBOW - COMPLETE 3+ VIEW COMPARISON:  Same day forearm radiographs FINDINGS: There is a subtle, angulated fracture of the left radial neck. Large elbow joint effusion. Soft tissues are unremarkable. IMPRESSION: There is a subtle, angulated fracture of the left radial neck, in keeping with  suspicion on prior forearm radiographs. Large elbow joint effusion. Electronically Signed   By: Lauralyn PrimesAlex  Bibbey M.D.   On: 12/10/2018 14:10   Dg Forearm Left  Result Date: 12/10/2018 CLINICAL DATA:  Fall EXAM: LEFT FOREARM - 2 VIEW COMPARISON:  None. FINDINGS: No definite fracture or dislocation of the left forearm. There may be subtle angulation of the left radial head and neck best seen on frontal view. Suspect elbow joint effusion. Included joint spaces are preserved. Intravenous catheter over the ventral soft tissues of the forearm. IMPRESSION: No definite fracture or dislocation of the left forearm. There may be subtle angulation of the left radial  head and neck best seen on frontal view. Suspect elbow joint effusion. Correlate for acute point tenderness and consider dedicated elbow radiographs if indicated by clinical suspicion. Electronically Signed   By: Lauralyn PrimesAlex  Bibbey M.D.   On: 12/10/2018 13:20   Dg Forearm Right  Result Date: 12/10/2018 CLINICAL DATA:  Fall EXAM: RIGHT HAND - COMPLETE 3+ VIEW; RIGHT FOREARM - 2 VIEW COMPARISON:  None. FINDINGS: No fracture or dislocation of the right hand or right forearm. Joint spaces are well preserved. Superficial radiopaque debris about the distal digits. Soft tissues are otherwise unremarkable. IMPRESSION: No fracture or dislocation of the right hand or right forearm. Electronically Signed   By: Lauralyn PrimesAlex  Bibbey M.D.   On: 12/10/2018 13:18   Dg Tibia/fibula Left  Result Date: 12/10/2018 CLINICAL DATA:  Fall EXAM: LEFT ANKLE COMPLETE - 3+ VIEW; LEFT TIBIA AND FIBULA - 2 VIEW COMPARISON:  None. FINDINGS: No fracture or dislocation of the left tibia or fibula. Partially imaged knee is intact. There are comminuted, displaced fractures of the body of the left calcaneus, involving the subtalar joint. The ankle mortise proper and talus are intact. Soft tissue edema about the ankle and heel. IMPRESSION: 1. No fracture or dislocation of the left tibia or fibula. 2. There are comminuted, displaced fractures of the body of the left calcaneus, involving the subtalar joint. The ankle mortise proper and talus are intact. Soft tissue edema about the ankle and heel. Electronically Signed   By: Lauralyn PrimesAlex  Bibbey M.D.   On: 12/10/2018 13:23   Dg Ankle Complete Left  Result Date: 12/10/2018 CLINICAL DATA:  Fall EXAM: LEFT ANKLE COMPLETE - 3+ VIEW; LEFT TIBIA AND FIBULA - 2 VIEW COMPARISON:  None. FINDINGS: No fracture or dislocation of the left tibia or fibula. Partially imaged knee is intact. There are comminuted, displaced fractures of the body of the left calcaneus, involving the subtalar joint. The ankle mortise proper and talus  are intact. Soft tissue edema about the ankle and heel. IMPRESSION: 1. No fracture or dislocation of the left tibia or fibula. 2. There are comminuted, displaced fractures of the body of the left calcaneus, involving the subtalar joint. The ankle mortise proper and talus are intact. Soft tissue edema about the ankle and heel. Electronically Signed   By: Lauralyn PrimesAlex  Bibbey M.D.   On: 12/10/2018 13:23   Ct Head Wo Contrast  Result Date: 12/10/2018 CLINICAL DATA:  Fall 10 free EXAM: CT HEAD WITHOUT CONTRAST CT CERVICAL SPINE WITHOUT CONTRAST TECHNIQUE: Multidetector CT imaging of the head and cervical spine was performed following the standard protocol without intravenous contrast. Multiplanar CT image reconstructions of the cervical spine were also generated. COMPARISON:  None. FINDINGS: CT HEAD FINDINGS Brain: No acute intracranial abnormality. Specifically, no hemorrhage, hydrocephalus, mass lesion, acute infarction, or significant intracranial injury. Vascular: No hyperdense vessel or unexpected calcification. Skull: No  acute calvarial abnormality. Sinuses/Orbits: Visualized paranasal sinuses and mastoids clear. Orbital soft tissues unremarkable. Other: None CT CERVICAL SPINE FINDINGS Alignment: Normal Skull base and vertebrae: No acute fracture. No primary bone lesion or focal pathologic process. Soft tissues and spinal canal: No prevertebral fluid or swelling. No visible canal hematoma. Disc levels: Degenerative spurring anteriorly from C4-5 through C6-7. Disc spaces maintained. Upper chest: Negative Other: None IMPRESSION: No acute intracranial abnormality. No acute bony abnormality in the cervical spine. Electronically Signed   By: Rolm Baptise M.D.   On: 12/10/2018 11:52   Ct Cervical Spine Wo Contrast  Result Date: 12/10/2018 CLINICAL DATA:  Fall 10 free EXAM: CT HEAD WITHOUT CONTRAST CT CERVICAL SPINE WITHOUT CONTRAST TECHNIQUE: Multidetector CT imaging of the head and cervical spine was performed  following the standard protocol without intravenous contrast. Multiplanar CT image reconstructions of the cervical spine were also generated. COMPARISON:  None. FINDINGS: CT HEAD FINDINGS Brain: No acute intracranial abnormality. Specifically, no hemorrhage, hydrocephalus, mass lesion, acute infarction, or significant intracranial injury. Vascular: No hyperdense vessel or unexpected calcification. Skull: No acute calvarial abnormality. Sinuses/Orbits: Visualized paranasal sinuses and mastoids clear. Orbital soft tissues unremarkable. Other: None CT CERVICAL SPINE FINDINGS Alignment: Normal Skull base and vertebrae: No acute fracture. No primary bone lesion or focal pathologic process. Soft tissues and spinal canal: No prevertebral fluid or swelling. No visible canal hematoma. Disc levels: Degenerative spurring anteriorly from C4-5 through C6-7. Disc spaces maintained. Upper chest: Negative Other: None IMPRESSION: No acute intracranial abnormality. No acute bony abnormality in the cervical spine. Electronically Signed   By: Rolm Baptise M.D.   On: 12/10/2018 11:52   Ct Thoracic Spine Wo Contrast  Result Date: 12/10/2018 CLINICAL DATA:  Status post fall, fell from 6-8 scaffolding onto concrete EXAM: CT THORACIC AND LUMBAR SPINE WITHOUT CONTRAST TECHNIQUE: Multidetector CT imaging of the thoracic and lumbar spine was performed without contrast. Multiplanar CT image reconstructions were also generated. COMPARISON:  None. FINDINGS: CT THORACIC SPINE FINDINGS Alignment: Normal. Vertebrae: No acute fracture or focal pathologic process. Paraspinal and other soft tissues: No acute paraspinal abnormality. Thoracic aortic atherosclerosis. Disc levels: Disc spaces are relatively well maintained. No foraminal or central canal stenosis. Anterior bridging osteophytes from T5 through T12 as can be seen with diffuse idiopathic skeletal hyperostosis. CT LUMBAR SPINE FINDINGS Segmentation: 5 lumbar type vertebrae. Alignment:  Normal. Vertebrae: No acute fracture or focal pathologic process. Paraspinal and other soft tissues: No acute paraspinal abnormality. Abdominal aortic atherosclerosis. Other: Mild osteoarthritis of bilateral sacroiliac joints. Disc levels: Disc spaces are maintained. Mild broad-based disc bulge at L3-4 with mild bilateral facet arthropathy. Mild broad-based disc bulge at L4-5 with mild bilateral facet arthropathy. Mild broad-based disc bulge at L5-S1 with bilateral facet arthropathy. No foraminal stenosis of the lumbar spine. No central canal stenosis of the lumbar spine. IMPRESSION: CT THORACIC SPINE IMPRESSION 1.  No acute osseous injury of the thoracic spine. CT LUMBAR SPINE IMPRESSION 1.  No acute osseous injury of the lumbar spine. 2.  Aortic Atherosclerosis (ICD10-I70.0). Electronically Signed   By: Kathreen Devoid   On: 12/10/2018 13:56   Ct Lumbar Spine Wo Contrast  Result Date: 12/10/2018 CLINICAL DATA:  Status post fall, fell from 6-8 scaffolding onto concrete EXAM: CT THORACIC AND LUMBAR SPINE WITHOUT CONTRAST TECHNIQUE: Multidetector CT imaging of the thoracic and lumbar spine was performed without contrast. Multiplanar CT image reconstructions were also generated. COMPARISON:  None. FINDINGS: CT THORACIC SPINE FINDINGS Alignment: Normal. Vertebrae: No acute fracture or focal  pathologic process. Paraspinal and other soft tissues: No acute paraspinal abnormality. Thoracic aortic atherosclerosis. Disc levels: Disc spaces are relatively well maintained. No foraminal or central canal stenosis. Anterior bridging osteophytes from T5 through T12 as can be seen with diffuse idiopathic skeletal hyperostosis. CT LUMBAR SPINE FINDINGS Segmentation: 5 lumbar type vertebrae. Alignment: Normal. Vertebrae: No acute fracture or focal pathologic process. Paraspinal and other soft tissues: No acute paraspinal abnormality. Abdominal aortic atherosclerosis. Other: Mild osteoarthritis of bilateral sacroiliac joints. Disc  levels: Disc spaces are maintained. Mild broad-based disc bulge at L3-4 with mild bilateral facet arthropathy. Mild broad-based disc bulge at L4-5 with mild bilateral facet arthropathy. Mild broad-based disc bulge at L5-S1 with bilateral facet arthropathy. No foraminal stenosis of the lumbar spine. No central canal stenosis of the lumbar spine. IMPRESSION: CT THORACIC SPINE IMPRESSION 1.  No acute osseous injury of the thoracic spine. CT LUMBAR SPINE IMPRESSION 1.  No acute osseous injury of the lumbar spine. 2.  Aortic Atherosclerosis (ICD10-I70.0). Electronically Signed   By: Elige Ko   On: 12/10/2018 13:56   Ct Ankle Left Wo Contrast  Result Date: 12/10/2018 CLINICAL DATA:  Status post fall.  Comminuted calcaneal fracture. EXAM: CT OF THE LEFT ANKLE WITHOUT CONTRAST TECHNIQUE: Multidetector CT imaging of the left ankle was performed according to the standard protocol. Multiplanar CT image reconstructions were also generated. COMPARISON:  Radiographs same date. FINDINGS: Bones/Joint/Cartilage There is a comminuted, intra-articular compression fracture of the calcaneus. This fracture has a component involving the posterior subtalar joint which is depressed by up to 3 mm. There is a nondisplaced component extending into the mid subtalar facet. There is a mildly displaced component extending into the lateral aspect of the calcaneocuboid articulation. The cortex of the calcaneal body laterally is moderately displaced without peroneal tendon entrapment. Small fracture fragments are also present along the posterior process of the talus, best seen on the sagittal images. The talus otherwise appears intact. No other tarsal bone fractures are identified. The distal tibia and fibula are intact. Well corticated ossicles are present along the distal fibular tip. Ligaments Suboptimally assessed by CT. Muscles and Tendons No tendon rupture or entrapment identified. There is a tiny type 1 accessory navicular. There is  also prominent os peroneum. Soft tissues Soft tissue swelling in the ankle and hindfoot without focal fluid collection. IMPRESSION: 1. Comminuted intra-articular compression fracture of the calcaneus as described, demonstrating a centrolateral depression configuration. There are mildly displaced components involving the posterior facet of the subtalar joint and the calcaneocuboid articulation. 2. No other significant tarsal bone fractures. Possible tiny avulsion fractures from the posterior process of the talus. 3. No evidence of ankle tendon entrapment or rupture. Electronically Signed   By: Carey Bullocks M.D.   On: 12/10/2018 16:41   Dg Hand Complete Left  Result Date: 12/10/2018 CLINICAL DATA:  Recent fall from scaffolding with hand pain, initial encounter EXAM: LEFT HAND - COMPLETE 3+ VIEW COMPARISON:  None. FINDINGS: No acute fracture is noted. Deformity at the base of the fifth metacarpal is noted consistent with prior healed fracture. No soft tissue abnormality is seen. IMPRESSION: No acute abnormality noted. Electronically Signed   By: Alcide Clever M.D.   On: 12/10/2018 13:16   Dg Hand Complete Right  Result Date: 12/10/2018 CLINICAL DATA:  Fall EXAM: RIGHT HAND - COMPLETE 3+ VIEW; RIGHT FOREARM - 2 VIEW COMPARISON:  None. FINDINGS: No fracture or dislocation of the right hand or right forearm. Joint spaces are well preserved. Superficial radiopaque  debris about the distal digits. Soft tissues are otherwise unremarkable. IMPRESSION: No fracture or dislocation of the right hand or right forearm. Electronically Signed   By: Lauralyn Primes M.D.   On: 12/10/2018 13:18    Positive ROS: All other systems have been reviewed and were otherwise negative with the exception of those mentioned in the HPI and as above.  12 point ROS was performed.  Physical Exam: General: Alert and oriented.  No apparent distress.  Vascular:  Left foot:Dorsalis Pedis:  present Posterior Tibial:  present  Right foot:  Not evaluated today  Neuro:intact sensation Derm: Bruising to the left foot.  No fracture blisters noted.  No open skin breaks at all.  Ortho/MS: Diffuse edema to left foot and ankle as expected.  Some guarded range of motion.  He is complaining of pain to this area.  Assessment: Comminuted intra-articular left calcaneal fracture  Plan: I discussed with him conservative versus surgical intervention.  He does have fragmentation in the posterior facet with some angulation of the fracture and compression of the posterior calcaneal tuber into the body.  At this point I recommend surgical intervention given his activity level.  He is very active at 68 years old and continues to work.  I believe we can perform ORIF to realign and reestablish his normal contour of the subtalar joint.  I discussed he will still have continued chronic pain and arthritic changes with stiffness of the joint postoperatively but this gives him his best chance of having a better long-term outcome.  He understands this and would like to go ahead and proceed with surgery.  We will set this up for this coming Friday.  I did place him in a compression splint today to try to decrease a little bit of the edema that he had.  We will order a Polar Care pack for his left foot and ankle.  We will follow-up tomorrow and consent for surgery will be performed at that time.    Irean Hong, DPM Cell [phone]   12/11/2018 5:36 PM

## 2018-12-12 LAB — SURGICAL PCR SCREEN
MRSA, PCR: NEGATIVE
Staphylococcus aureus: NEGATIVE

## 2018-12-12 MED ORDER — MUPIROCIN 2 % EX OINT
1.0000 "application " | TOPICAL_OINTMENT | Freq: Two times a day (BID) | CUTANEOUS | Status: DC
  Filled 2018-12-12: qty 22

## 2018-12-12 MED ORDER — CEFAZOLIN SODIUM-DEXTROSE 2-4 GM/100ML-% IV SOLN
2.0000 g | INTRAVENOUS | Status: AC
  Administered 2018-12-13: 2 g via INTRAVENOUS
  Filled 2018-12-12: qty 100

## 2018-12-12 NOTE — Progress Notes (Addendum)
[facility] [place] [facility]   PATIENT NAME: Randall [name]    MR#:  [id number]  DATE OF BIRTH:  [birth year]  SUBJECTIVE:   Patient here after falling from scaffold while at work. He has fracture calcaneal and elbow. Complains of pain Polar care placed on Left foot REVIEW OF SYSTEMS:   Review of Systems  Constitutional: Negative for chills, fever and weight loss.  HENT: Negative for ear discharge, ear pain and nosebleeds.   Eyes: Negative for blurred vision, pain and discharge.  Respiratory: Negative for sputum production, shortness of breath, wheezing and stridor.   Cardiovascular: Negative for chest pain, palpitations, orthopnea and PND.  Gastrointestinal: Negative for abdominal pain, diarrhea, nausea and vomiting.  Genitourinary: Negative for frequency and urgency.  Musculoskeletal: Positive for joint pain. Negative for back pain.  Neurological: Positive for weakness. Negative for sensory change, speech change and focal weakness.  Psychiatric/Behavioral: Negative for depression and hallucinations. The patient is not nervous/anxious.    Tolerating Diet: yes Tolerating PT: SNF  DRUG ALLERGIES:   Allergies  Allergen Reactions  . Quetiapine Other (See Comments)    Suicidal thoughts    VITALS:  Blood pressure 134/76, pulse 95, temperature 98.5 F (36.9 C), resp. rate 18, height  (1.702 m), weight 79.4 kg, SpO2 98 %.  PHYSICAL EXAMINATION:   Physical Exam  GENERAL:  68 y.o.-year-old patient lying in the bed with no acute distress.  EYES: Pupils equal, round, reactive to light and accommodation. No scleral icterus. Extraocular muscles intact.  HEENT: Head atraumatic, normocephalic. Oropharynx and nasopharynx clear.  NECK:  Supple, no jugular venous distention. No thyroid enlargement, no tenderness.  LUNGS: Normal breath sounds bilaterally, no wheezing, rales, rhonchi. No use of accessory muscles of respiration.  CARDIOVASCULAR: S1, S2  normal. No murmurs, rubs, or gallops.  ABDOMEN: Soft, nontender, nondistended. Bowel sounds present. No organomegaly or mass.  EXTREMITIES: No cyanosis, clubbing or edema b/l.   Left lower extremity cast present. Left upper extremity sling present. NEUROLOGIC: Cranial nerves II through XII are intact. No focal Motor or sensory deficits b/l.   PSYCHIATRIC:  patient is alert and oriented x 3.  SKIN: No obvious rash, lesion, or ulcer.   LABORATORY PANEL:  CBC Recent Labs  Lab 12/11/18 0604  WBC 8.6  HGB 12.3*  HCT 36.6*  PLT 204    Chemistries  Recent Labs  Lab 12/11/18 0604  NA 138  K 3.7  CL 104  CO2 24  GLUCOSE 108*  BUN 20  CREATININE 0.73  CALCIUM 8.7*  AST 22  ALT 17  ALKPHOS 40  BILITOT 1.2   Cardiac Enzymes No results for input(s): TROPONINI in the last 168 hours. RADIOLOGY:  Dg Chest 2 View  Result Date: 12/10/2018 CLINICAL DATA:  Fall EXAM: CHEST - 2 VIEW COMPARISON:  01/31/2013 FINDINGS: The heart size and mediastinal contours are within normal limits. Both lungs are clear. Disc degenerative disease of the thoracic spine. Nonacute fracture deformities of the posterior left ribs. IMPRESSION: No acute abnormality of the lungs. Electronically Signed   By: Lauralyn Primes M.D.   On: 12/10/2018 13:16   Dg Elbow Complete Left  Result Date: 12/10/2018 CLINICAL DATA:  Fall EXAM: LEFT ELBOW - COMPLETE 3+ VIEW COMPARISON:  Same day forearm radiographs FINDINGS: There is a subtle, angulated fracture of the left radial neck. Large elbow joint effusion. Soft tissues are unremarkable. IMPRESSION: There is a subtle, angulated fracture of the left radial neck, in keeping with suspicion  on prior forearm radiographs. Large elbow joint effusion. Electronically Signed   By: Eddie Candle M.D.   On: 12/10/2018 14:10   Dg Forearm Left  Result Date: 12/10/2018 CLINICAL DATA:  Fall EXAM: LEFT FOREARM - 2 VIEW COMPARISON:  None. FINDINGS: No definite fracture or dislocation of the left  forearm. There may be subtle angulation of the left radial head and neck best seen on frontal view. Suspect elbow joint effusion. Included joint spaces are preserved. Intravenous catheter over the ventral soft tissues of the forearm. IMPRESSION: No definite fracture or dislocation of the left forearm. There may be subtle angulation of the left radial head and neck best seen on frontal view. Suspect elbow joint effusion. Correlate for acute point tenderness and consider dedicated elbow radiographs if indicated by clinical suspicion. Electronically Signed   By: Eddie Candle M.D.   On: 12/10/2018 13:20   Dg Forearm Right  Result Date: 12/10/2018 CLINICAL DATA:  Fall EXAM: RIGHT HAND - COMPLETE 3+ VIEW; RIGHT FOREARM - 2 VIEW COMPARISON:  None. FINDINGS: No fracture or dislocation of the right hand or right forearm. Joint spaces are well preserved. Superficial radiopaque debris about the distal digits. Soft tissues are otherwise unremarkable. IMPRESSION: No fracture or dislocation of the right hand or right forearm. Electronically Signed   By: Eddie Candle M.D.   On: 12/10/2018 13:18   Dg Tibia/fibula Left  Result Date: 12/10/2018 CLINICAL DATA:  Fall EXAM: LEFT ANKLE COMPLETE - 3+ VIEW; LEFT TIBIA AND FIBULA - 2 VIEW COMPARISON:  None. FINDINGS: No fracture or dislocation of the left tibia or fibula. Partially imaged knee is intact. There are comminuted, displaced fractures of the body of the left calcaneus, involving the subtalar joint. The ankle mortise proper and talus are intact. Soft tissue edema about the ankle and heel. IMPRESSION: 1. No fracture or dislocation of the left tibia or fibula. 2. There are comminuted, displaced fractures of the body of the left calcaneus, involving the subtalar joint. The ankle mortise proper and talus are intact. Soft tissue edema about the ankle and heel. Electronically Signed   By: Eddie Candle M.D.   On: 12/10/2018 13:23   Dg Ankle Complete Left  Result Date:  12/10/2018 CLINICAL DATA:  Fall EXAM: LEFT ANKLE COMPLETE - 3+ VIEW; LEFT TIBIA AND FIBULA - 2 VIEW COMPARISON:  None. FINDINGS: No fracture or dislocation of the left tibia or fibula. Partially imaged knee is intact. There are comminuted, displaced fractures of the body of the left calcaneus, involving the subtalar joint. The ankle mortise proper and talus are intact. Soft tissue edema about the ankle and heel. IMPRESSION: 1. No fracture or dislocation of the left tibia or fibula. 2. There are comminuted, displaced fractures of the body of the left calcaneus, involving the subtalar joint. The ankle mortise proper and talus are intact. Soft tissue edema about the ankle and heel. Electronically Signed   By: Eddie Candle M.D.   On: 12/10/2018 13:23   Ct Head Wo Contrast  Result Date: 12/10/2018 CLINICAL DATA:  Fall 10 free EXAM: CT HEAD WITHOUT CONTRAST CT CERVICAL SPINE WITHOUT CONTRAST TECHNIQUE: Multidetector CT imaging of the head and cervical spine was performed following the standard protocol without intravenous contrast. Multiplanar CT image reconstructions of the cervical spine were also generated. COMPARISON:  None. FINDINGS: CT HEAD FINDINGS Brain: No acute intracranial abnormality. Specifically, no hemorrhage, hydrocephalus, mass lesion, acute infarction, or significant intracranial injury. Vascular: No hyperdense vessel or unexpected calcification. Skull: No acute  calvarial abnormality. Sinuses/Orbits: Visualized paranasal sinuses and mastoids clear. Orbital soft tissues unremarkable. Other: None CT CERVICAL SPINE FINDINGS Alignment: Normal Skull base and vertebrae: No acute fracture. No primary bone lesion or focal pathologic process. Soft tissues and spinal canal: No prevertebral fluid or swelling. No visible canal hematoma. Disc levels: Degenerative spurring anteriorly from C4-5 through C6-7. Disc spaces maintained. Upper chest: Negative Other: None IMPRESSION: No acute intracranial abnormality. No  acute bony abnormality in the cervical spine. Electronically Signed   By: Charlett Nose M.D.   On: 12/10/2018 11:52   Ct Cervical Spine Wo Contrast  Result Date: 12/10/2018 CLINICAL DATA:  Fall 10 free EXAM: CT HEAD WITHOUT CONTRAST CT CERVICAL SPINE WITHOUT CONTRAST TECHNIQUE: Multidetector CT imaging of the head and cervical spine was performed following the standard protocol without intravenous contrast. Multiplanar CT image reconstructions of the cervical spine were also generated. COMPARISON:  None. FINDINGS: CT HEAD FINDINGS Brain: No acute intracranial abnormality. Specifically, no hemorrhage, hydrocephalus, mass lesion, acute infarction, or significant intracranial injury. Vascular: No hyperdense vessel or unexpected calcification. Skull: No acute calvarial abnormality. Sinuses/Orbits: Visualized paranasal sinuses and mastoids clear. Orbital soft tissues unremarkable. Other: None CT CERVICAL SPINE FINDINGS Alignment: Normal Skull base and vertebrae: No acute fracture. No primary bone lesion or focal pathologic process. Soft tissues and spinal canal: No prevertebral fluid or swelling. No visible canal hematoma. Disc levels: Degenerative spurring anteriorly from C4-5 through C6-7. Disc spaces maintained. Upper chest: Negative Other: None IMPRESSION: No acute intracranial abnormality. No acute bony abnormality in the cervical spine. Electronically Signed   By: Charlett Nose M.D.   On: 12/10/2018 11:52   Ct Thoracic Spine Wo Contrast  Result Date: 12/10/2018 CLINICAL DATA:  Status post fall, fell from 6-8 scaffolding onto concrete EXAM: CT THORACIC AND LUMBAR SPINE WITHOUT CONTRAST TECHNIQUE: Multidetector CT imaging of the thoracic and lumbar spine was performed without contrast. Multiplanar CT image reconstructions were also generated. COMPARISON:  None. FINDINGS: CT THORACIC SPINE FINDINGS Alignment: Normal. Vertebrae: No acute fracture or focal pathologic process. Paraspinal and other soft tissues: No  acute paraspinal abnormality. Thoracic aortic atherosclerosis. Disc levels: Disc spaces are relatively well maintained. No foraminal or central canal stenosis. Anterior bridging osteophytes from T5 through T12 as can be seen with diffuse idiopathic skeletal hyperostosis. CT LUMBAR SPINE FINDINGS Segmentation: 5 lumbar type vertebrae. Alignment: Normal. Vertebrae: No acute fracture or focal pathologic process. Paraspinal and other soft tissues: No acute paraspinal abnormality. Abdominal aortic atherosclerosis. Other: Mild osteoarthritis of bilateral sacroiliac joints. Disc levels: Disc spaces are maintained. Mild broad-based disc bulge at L3-4 with mild bilateral facet arthropathy. Mild broad-based disc bulge at L4-5 with mild bilateral facet arthropathy. Mild broad-based disc bulge at L5-S1 with bilateral facet arthropathy. No foraminal stenosis of the lumbar spine. No central canal stenosis of the lumbar spine. IMPRESSION: CT THORACIC SPINE IMPRESSION 1.  No acute osseous injury of the thoracic spine. CT LUMBAR SPINE IMPRESSION 1.  No acute osseous injury of the lumbar spine. 2.  Aortic Atherosclerosis (ICD10-I70.0). Electronically Signed   By: Elige Ko   On: 12/10/2018 13:56   Ct Lumbar Spine Wo Contrast  Result Date: 12/10/2018 CLINICAL DATA:  Status post fall, fell from 6-8 scaffolding onto concrete EXAM: CT THORACIC AND LUMBAR SPINE WITHOUT CONTRAST TECHNIQUE: Multidetector CT imaging of the thoracic and lumbar spine was performed without contrast. Multiplanar CT image reconstructions were also generated. COMPARISON:  None. FINDINGS: CT THORACIC SPINE FINDINGS Alignment: Normal. Vertebrae: No acute fracture or focal pathologic  process. Paraspinal and other soft tissues: No acute paraspinal abnormality. Thoracic aortic atherosclerosis. Disc levels: Disc spaces are relatively well maintained. No foraminal or central canal stenosis. Anterior bridging osteophytes from T5 through T12 as can be seen with  diffuse idiopathic skeletal hyperostosis. CT LUMBAR SPINE FINDINGS Segmentation: 5 lumbar type vertebrae. Alignment: Normal. Vertebrae: No acute fracture or focal pathologic process. Paraspinal and other soft tissues: No acute paraspinal abnormality. Abdominal aortic atherosclerosis. Other: Mild osteoarthritis of bilateral sacroiliac joints. Disc levels: Disc spaces are maintained. Mild broad-based disc bulge at L3-4 with mild bilateral facet arthropathy. Mild broad-based disc bulge at L4-5 with mild bilateral facet arthropathy. Mild broad-based disc bulge at L5-S1 with bilateral facet arthropathy. No foraminal stenosis of the lumbar spine. No central canal stenosis of the lumbar spine. IMPRESSION: CT THORACIC SPINE IMPRESSION 1.  No acute osseous injury of the thoracic spine. CT LUMBAR SPINE IMPRESSION 1.  No acute osseous injury of the lumbar spine. 2.  Aortic Atherosclerosis (ICD10-I70.0). Electronically Signed   By: Elige KoHetal  Jerie Basford   On: 12/10/2018 13:56   Ct Ankle Left Wo Contrast  Result Date: 12/10/2018 CLINICAL DATA:  Status post fall.  Comminuted calcaneal fracture. EXAM: CT OF THE LEFT ANKLE WITHOUT CONTRAST TECHNIQUE: Multidetector CT imaging of the left ankle was performed according to the standard protocol. Multiplanar CT image reconstructions were also generated. COMPARISON:  Radiographs same date. FINDINGS: Bones/Joint/Cartilage There is a comminuted, intra-articular compression fracture of the calcaneus. This fracture has a component involving the posterior subtalar joint which is depressed by up to 3 mm. There is a nondisplaced component extending into the mid subtalar facet. There is a mildly displaced component extending into the lateral aspect of the calcaneocuboid articulation. The cortex of the calcaneal body laterally is moderately displaced without peroneal tendon entrapment. Small fracture fragments are also present along the posterior process of the talus, best seen on the sagittal images.  The talus otherwise appears intact. No other tarsal bone fractures are identified. The distal tibia and fibula are intact. Well corticated ossicles are present along the distal fibular tip. Ligaments Suboptimally assessed by CT. Muscles and Tendons No tendon rupture or entrapment identified. There is a tiny type 1 accessory navicular. There is also prominent os peroneum. Soft tissues Soft tissue swelling in the ankle and hindfoot without focal fluid collection. IMPRESSION: 1. Comminuted intra-articular compression fracture of the calcaneus as described, demonstrating a centrolateral depression configuration. There are mildly displaced components involving the posterior facet of the subtalar joint and the calcaneocuboid articulation. 2. No other significant tarsal bone fractures. Possible tiny avulsion fractures from the posterior process of the talus. 3. No evidence of ankle tendon entrapment or rupture. Electronically Signed   By: Carey BullocksWilliam  Veazey M.D.   On: 12/10/2018 16:41   Dg Hand Complete Left  Result Date: 12/10/2018 CLINICAL DATA:  Recent fall from scaffolding with hand pain, initial encounter EXAM: LEFT HAND - COMPLETE 3+ VIEW COMPARISON:  None. FINDINGS: No acute fracture is noted. Deformity at the base of the fifth metacarpal is noted consistent with prior healed fracture. No soft tissue abnormality is seen. IMPRESSION: No acute abnormality noted. Electronically Signed   By: Alcide CleverMark  Lukens M.D.   On: 12/10/2018 13:16   Dg Hand Complete Right  Result Date: 12/10/2018 CLINICAL DATA:  Fall EXAM: RIGHT HAND - COMPLETE 3+ VIEW; RIGHT FOREARM - 2 VIEW COMPARISON:  None. FINDINGS: No fracture or dislocation of the right hand or right forearm. Joint spaces are well preserved. Superficial radiopaque debris  about the distal digits. Soft tissues are otherwise unremarkable. IMPRESSION: No fracture or dislocation of the right hand or right forearm. Electronically Signed   By: Lauralyn Primes M.D.   On: 12/10/2018  13:18   ASSESSMENT AND PLAN:  Dorrian Doggett  is a 68 y.o. 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  X-rays have revealed left calcaneal fracture and left radial neck fracture  #Acute left foot pain from left calcaneal fracture -Podiatry consult with  Dr. Clearnce Hasten splint placement and now scheduled for ORIF tomorrow. Pt is at low risk for surgery -Pain management as needed -CT head is negative  #Left radial neck fracture -Consult placed to orthopedics Dr. Signa Kell-- no sling required. Patient can do his routine activity.  #Right upper extremity/shoulder rotator cuff tear. Patient will follow-up with Dr. Allena Katz to see if he wants to consider surgical option. This can be done as outpatient. -Pain management as needed  #Leukocytosis probably reactive Patient is afebrile  CSW/CM for d/c planning  Case discussed with Care Management/Social Worker. Management plans discussed with the patient  and they are in agreement.  CODE STATUS: full  DVT Prophylaxis: lovenox  TOTAL TIME TAKING CARE OF THIS PATIENT: *30* minutes.  >50% time spent on counselling and coordination of care  POSSIBLE D/C IN *1-2* DAYS, DEPENDING ON CLINICAL CONDITION.  Note: This dictation was prepared with Dragon dictation along with smaller phrase technology. Any transcriptional errors that result from this process are unintentional.  Enedina Finner M.D on 12/12/2018 at 10:37 AM  Between 7am to 6pm - Pager - [phone]  After 6pm go to [email or web address] - Social research officer, government  [facility]  Office  [phone]  CC: Primary care physician; Emogene Morgan, MDPatient ID: Randall [name], male   DOB: 1950-04-14, 68 y.o.   MRN: [id number]

## 2018-12-12 NOTE — Progress Notes (Signed)
OT Cancellation Note  Patient Details Name: Randall [name] MRN: [id number] DOB: 02-May-1950   Cancelled Treatment:    Reason Eval/Treat Not Completed: Other (comment) Thank you for the OT consult. Order received and chart reviewed. Pt noted to be scheduled for surgical intervention for Left calcaneal fracture tomorrow (12/13/18). Will follow acutely and hold OT evaluation until after sx.    Shara Blazing, M.S., OTR/L Ascom: [phone] 12/12/18, 12:57 PM

## 2018-12-12 NOTE — Progress Notes (Signed)
Physical Therapy Treatment Patient Details Name: Randall [name] MRN: [id number] DOB: 1950/06/25 Today's Date: 12/12/2018    History of Present Illness Pt is a 68 y.o. male presenting to hospital s/p fall 9/29 from scaffold onto L side.  Imaging showing comminuted displaced fx's of body of L calcaneus involving subtalar joint and possible tiny avulsion fx's posterior process of talus; also possible subtle angulated fx L radial neck and large elbow joint effusion.  H/o R shoulder rotator cuff tears.  Currently pt non-op.    PT Comments    Pt awaiting surgical intervention tomorrow for fx.  Discussed mobility and discharge.  Hands remain bruised and painful limiting ability to use RW.  He liked knee scooter but felt as though a wheelchair would be better at discharge.  Discussed why crutches were not appropriate at this time.  Transferred to wheelchair and pt was able to navigate unit using BUE's to propell.  Education on brakes, safety and leg rests given.  Reviewed set up for transfers and appropriate technique.  While SNF was recommended at eval, pt stated he feels comfortable with going home at wheelchair level.  Scheduled surgery planned for tomorrow.  Will continue with mobility as appropriate and adjust discharge recommendations as needed.   Follow Up Recommendations  SNF     Equipment Recommendations  3in1 (PT);Wheelchair (measurements PT);Wheelchair cushion (measurements PT)    Recommendations for Other Services       Precautions / Restrictions Restrictions Weight Bearing Restrictions: Yes LUE Weight Bearing: Weight bearing as tolerated LLE Weight Bearing: Non weight bearing    Mobility  Bed Mobility Overal bed mobility: Modified Independent                Transfers Overall transfer level: Needs assistance   Transfers: Sit to/from Stand;Stand Pivot Transfers Sit to Stand: Min guard Stand pivot transfers: Min guard       General transfer comment: transfers  with verbal cues and eduation for safety and technique.  Ambulation/Gait             General Gait Details: unable d/t pt unable to bear weight through UE's to attempt (d/t B hand/wrist pain)   Psychologist, counselling mobility: Yes Wheelchair propulsion: Both upper extremities Wheelchair parts: Supervision/cueing Distance: 160' Wheelchair Assistance Details (indicate cue type and reason): used BUE's and educated on footrest, brakes and general safety and wc set up for transfers.  Modified Rankin (Stroke Patients Only)       Balance Overall balance assessment: Needs assistance Sitting-balance support: No upper extremity supported;Feet unsupported Sitting balance-Leahy Scale: Normal Sitting balance - Comments: steady sitting reaching outside BOS   Standing balance support: Single extremity supported Standing balance-Leahy Scale: Poor Standing balance comment: pt requiring at least single UE support for static standing balance                            Cognition Arousal/Alertness: Awake/alert Behavior During Therapy: WFL for tasks assessed/performed Overall Cognitive Status: Within Functional Limits for tasks assessed                                        Exercises      General Comments        Pertinent Vitals/Pain Pain Assessment: 0-10 Pain Score: 5  Pain Location: R forearm and wrist; L ankle/foot Pain Descriptors / Indicators: Tender;Sore;Constant;Discomfort;Grimacing;Guarding Pain Intervention(s): Limited activity within patient's tolerance;Monitored during session    Home Living                      Prior Function            PT Goals (current goals can now be found in the care plan section) Progress towards PT goals: Progressing toward goals    Frequency    7X/week      PT Plan Current plan remains appropriate;Other (comment)    Co-evaluation               AM-PAC PT "6 Clicks" Mobility   Outcome Measure  Help needed turning from your back to your side while in a flat bed without using bedrails?: None Help needed moving from lying on your back to sitting on the side of a flat bed without using bedrails?: None Help needed moving to and from a bed to a chair (including a wheelchair)?: A Little Help needed standing up from a chair using your arms (e.g., wheelchair or bedside chair)?: A Little Help needed to walk in hospital room?: A Lot Help needed climbing 3-5 steps with a railing? : Total 6 Click Score: 17    End of Session Equipment Utilized During Treatment: Gait belt Activity Tolerance: Patient tolerated treatment well Patient left: in chair;with call bell/phone within reach;with chair alarm set Nurse Communication: Mobility status;Weight bearing status Pain - Right/Left: Left Pain - part of body: Ankle and joints of foot     Time: 1023-1035 PT Time Calculation (min) (ACUTE ONLY): 12 min  Charges:  $Therapeutic Activity: 8-22 mins                    Chesley Noon, PTA 12/12/18, 11:03 AM

## 2018-12-12 NOTE — TOC Progression Note (Signed)
Transition of Care [facility]) - Progression Note    Patient Details  Name: Randall [name] MRN: [id number] Date of Birth: 06-06-1950  [facility] CM/SW [place], RN Phone Number: 12/12/2018, 9:12 AM  Clinical Narrative:    FL2 completed, PASSR completed, Bed search sent, will review with the patient once bed offers are obtained   Expected Discharge Plan: Home/Self Care Barriers to Discharge: Continued Medical Work up  Expected Discharge Plan and Services Expected Discharge Plan: Home/Self Care   Discharge Planning Services: CM Consult   Living arrangements for the past 2 months: Single Family Home                 DME Arranged: Crutches DME Agency: [employer] Date DME Agency Contacted: 12/11/18 Time DME Agency Contacted: [phone] Representative spoke with at DME Agency: Leroy [facility] HH Arranged: NA           Social Determinants of Health ([place]) Interventions    Readmission Risk Interventions No flowsheet data found.

## 2018-12-12 NOTE — Progress Notes (Signed)
Daily Progress Note   Subjective  - * No surgery date entered *  Fu left calc fracture  Objective Vitals:   12/11/18 0738 12/11/18 1518 12/11/18 2344 12/12/18 0741  BP: 110/72 (!) 122/54 118/60 134/76  Pulse: 77 81 66 95  Resp:   18 18  Temp: 98.4 F (36.9 C) 99.5 F (37.5 C) 98.4 F (36.9 C) 98.5 F (36.9 C)  TempSrc: Oral Oral    SpO2: 98% 97% 97% 98%  Weight:      Height:        Physical Exam: Polar care intact.   Splint intact.  Laboratory CBC    Component Value Date/Time   WBC 8.6 12/11/2018 0604   HGB 12.3 (L) 12/11/2018 0604   HGB 13.3 01/31/2013 1523   HCT 36.6 (L) 12/11/2018 0604   HCT 38.7 (L) 01/31/2013 1523   PLT 204 12/11/2018 0604   PLT 311 01/31/2013 1523    BMET    Component Value Date/Time   NA 138 12/11/2018 0604   NA 136 01/31/2013 1523   K 3.7 12/11/2018 0604   K 3.2 (L) 01/31/2013 1523   CL 104 12/11/2018 0604   CL 104 01/31/2013 1523   CO2 24 12/11/2018 0604   CO2 25 01/31/2013 1523   GLUCOSE 108 (H) 12/11/2018 0604   GLUCOSE 83 01/31/2013 1523   BUN 20 12/11/2018 0604   BUN 9 01/31/2013 1523   CREATININE 0.73 12/11/2018 0604   CREATININE 0.87 01/31/2013 1523   CALCIUM 8.7 (L) 12/11/2018 0604   CALCIUM 8.9 01/31/2013 1523   GFRNONAA >60 12/11/2018 0604   GFRNONAA >60 01/31/2013 1523   GFRAA >60 12/11/2018 0604   GFRAA >60 01/31/2013 1523    Assessment/Planning: Left calcaneal fracture   Plan for ORIF tomorrow.  D/W pt and consent given  Will need to remain NWB  Samara Deist A  12/12/2018, 1:16 PM

## 2018-12-12 NOTE — Anesthesia Preprocedure Evaluation (Deleted)
Anesthesia Evaluation    Airway        Dental   Pulmonary former smoker,           Cardiovascular      Neuro/Psych    GI/Hepatic   Endo/Other    Renal/GU      Musculoskeletal   Abdominal   Peds  Hematology   Anesthesia Other Findings History reviewed. No pertinent past medical history.   Reproductive/Obstetrics                             Anesthesia Physical Anesthesia Plan Anesthesia Quick Evaluation

## 2018-12-12 NOTE — TOC Progression Note (Signed)
Transition of Care [facility]) - Progression Note    Patient Details  Name: Randall [name] MRN: [id number] Date of Birth: 1950-04-27  Transition of [facility] CM/SW [place], RN Phone Number: 12/12/2018, 3:43 PM  Clinical Narrative:    The patient worked with PT using a [place] and felt confident that he will want to use that when he discharges and feels that with a WC he would be able to go home nad not to rehab.  I called Brad with [employer] and notified him of the need and emailed him as well   Expected Discharge Plan: Home/Self Care Barriers to Discharge: Continued Medical Work up  Expected Discharge Plan and Services Expected Discharge Plan: Home/Self Care   Discharge Planning Services: CM Consult   Living arrangements for the past 2 months: Single Family Home                 DME Arranged: Wheelchair manual DME Agency: [employer] Date DME Agency Contacted: 12/12/18 Time DME Agency Contacted: [phone] Representative spoke with at DME Agency: [place]: NA           Social Determinants of Health ([place]) Interventions    Readmission Risk Interventions No flowsheet data found.

## 2018-12-12 NOTE — NC FL2 (Signed)
  [place] LEVEL OF CARE SCREENING TOOL     IDENTIFICATION  Patient Name: Randall [name] Birthdate: September 24, 1950 Sex: male Admission Date (Current Location): 12/10/2018  [place] and [place] Number:  Engineering geologist and Address:  [facility], [address] [place], [place] [address]      Provider Number: [id number]  Attending Physician Name and Address:  Fritzi Mandes, MD  Relative Name and Phone Number:  [facility]    Current Level of Care: Hospital Recommended Level of Care: [place] Prior Approval Number:    Date Approved/Denied:   PASRR Number: [id number]  Discharge Plan: SNF    Current Diagnoses: Patient Active Problem List   Diagnosis Date Noted  . Calcaneal fracture 12/10/2018    Orientation RESPIRATION BLADDER Height & Weight     Self, Time, Situation, Place  Normal Continent Weight: 79.4 kg Height:  5\' 7"  (170.2 cm)  BEHAVIORAL SYMPTOMS/MOOD NEUROLOGICAL BOWEL NUTRITION STATUS      Continent    AMBULATORY STATUS COMMUNICATION OF NEEDS Skin   Extensive Assist Verbally Surgical wounds                       Personal Care Assistance Level of Assistance              Functional Limitations Info  Sight, Hearing, Speech Sight Info: Adequate Hearing Info: Adequate Speech Info: Adequate    SPECIAL CARE FACTORS FREQUENCY  PT (By licensed PT)     PT Frequency: 5 times per week              Contractures Contractures Info: Not present    Additional Factors Info  Allergies   Allergies Info: Quetiapine           Current Medications (12/12/2018):  This is the current hospital active medication list Current Facility-Administered Medications  Medication Dose Route Frequency Provider Last Rate Last Dose  . acetaminophen (TYLENOL) tablet 650 mg  650 mg Oral Q6H PRN Gouru, Aruna, MD       Or  . acetaminophen (TYLENOL) suppository 650 mg  650 mg Rectal Q6H PRN Gouru, Aruna, MD      .  docusate sodium (COLACE) capsule 100 mg  100 mg Oral BID Gouru, Aruna, MD   100 mg at 12/12/18 0810  . morphine 2 MG/ML injection 2 mg  2 mg Intravenous Q4H PRN Gouru, Aruna, MD      . ondansetron (ZOFRAN) tablet 4 mg  4 mg Oral Q6H PRN Gouru, Aruna, MD       Or  . ondansetron (ZOFRAN) injection 4 mg  4 mg Intravenous Q6H PRN Gouru, Aruna, MD      . oxyCODONE-acetaminophen (PERCOCET/ROXICET) 5-325 MG per tablet 1 tablet  1 tablet Oral Q6H PRN Fritzi Mandes, MD   1 tablet at 12/12/18 0810     Discharge Medications: Please see discharge summary for a list of discharge medications.  Relevant Imaging Results:  Relevant Lab Results:   Additional Information [id number]  Su Hilt, RN

## 2018-12-13 ENCOUNTER — Encounter: Payer: Self-pay | Admitting: Anesthesiology

## 2018-12-13 ENCOUNTER — Inpatient Hospital Stay: Payer: Medicare Other

## 2018-12-13 ENCOUNTER — Inpatient Hospital Stay: Payer: Medicare Other | Admitting: Anesthesiology

## 2018-12-13 ENCOUNTER — Encounter
Admission: EM | Disposition: A | Discharge status: Skilled Nursing Facility | Payer: Self-pay | Source: Home / Self Care | Attending: Internal Medicine

## 2018-12-13 HISTORY — PX: ORIF CALCANEOUS FRACTURE: SHX5030

## 2018-12-13 SURGERY — OPEN REDUCTION INTERNAL FIXATION (ORIF) CALCANEOUS FRACTURE
Anesthesia: General | Laterality: Left

## 2018-12-13 MED ORDER — FENTANYL CITRATE (PF) 100 MCG/2ML IJ SOLN
INTRAMUSCULAR | Status: AC
  Filled 2018-12-13: qty 2

## 2018-12-13 MED ORDER — FENTANYL CITRATE (PF) 100 MCG/2ML IJ SOLN
INTRAMUSCULAR | Status: AC
  Administered 2018-12-13: 50 ug via INTRAVENOUS
  Filled 2018-12-13: qty 2

## 2018-12-13 MED ORDER — LIDOCAINE HCL (PF) 2 % IJ SOLN
INTRAMUSCULAR | Status: AC
  Filled 2018-12-13: qty 10

## 2018-12-13 MED ORDER — BUPIVACAINE HCL (PF) 0.5 % IJ SOLN
INTRAMUSCULAR | Status: AC
  Filled 2018-12-13: qty 30

## 2018-12-13 MED ORDER — EPINEPHRINE PF 1 MG/ML IJ SOLN
INTRAMUSCULAR | Status: AC
  Filled 2018-12-13: qty 1

## 2018-12-13 MED ORDER — ONDANSETRON HCL 4 MG/2ML IJ SOLN
INTRAMUSCULAR | Status: DC | PRN
  Administered 2018-12-13: 4 mg via INTRAVENOUS

## 2018-12-13 MED ORDER — BUPIVACAINE LIPOSOME 1.3 % IJ SUSP
INTRAMUSCULAR | Status: DC | PRN
  Administered 2018-12-13: 20 mL

## 2018-12-13 MED ORDER — PHENYLEPHRINE HCL (PRESSORS) 10 MG/ML IV SOLN
INTRAVENOUS | Status: DC | PRN
  Administered 2018-12-13: 100 ug via INTRAVENOUS

## 2018-12-13 MED ORDER — CEFAZOLIN SODIUM-DEXTROSE 2-4 GM/100ML-% IV SOLN
INTRAVENOUS | Status: AC
  Filled 2018-12-13: qty 100

## 2018-12-13 MED ORDER — BUPIVACAINE-EPINEPHRINE 0.25% -1:200000 IJ SOLN
INTRAMUSCULAR | Status: DC | PRN
  Administered 2018-12-13: 10 mL

## 2018-12-13 MED ORDER — LIDOCAINE HCL (PF) 1 % IJ SOLN
INTRAMUSCULAR | Status: AC
  Filled 2018-12-13: qty 5

## 2018-12-13 MED ORDER — FENTANYL CITRATE (PF) 100 MCG/2ML IJ SOLN: 25.0000 ug | INTRAMUSCULAR | Status: DC | PRN

## 2018-12-13 MED ORDER — BUPIVACAINE HCL (PF) 0.5 % IJ SOLN
INTRAMUSCULAR | Status: DC | PRN
  Administered 2018-12-13: 30 mL via PERINEURAL

## 2018-12-13 MED ORDER — PROPOFOL 10 MG/ML IV BOLUS
INTRAVENOUS | Status: AC
  Filled 2018-12-13: qty 20

## 2018-12-13 MED ORDER — MIDAZOLAM HCL 2 MG/2ML IJ SOLN
1.0000 mg | Freq: Once | INTRAMUSCULAR | Status: AC
  Administered 2018-12-13: 09:00:00 1 mg via INTRAVENOUS

## 2018-12-13 MED ORDER — LIDOCAINE HCL (CARDIAC) PF 100 MG/5ML IV SOSY
PREFILLED_SYRINGE | INTRAVENOUS | Status: DC | PRN
  Administered 2018-12-13: 80 mg via INTRAVENOUS

## 2018-12-13 MED ORDER — LIDOCAINE HCL (PF) 1 % IJ SOLN
INTRAMUSCULAR | Status: DC | PRN
  Administered 2018-12-13: 5 mL via SUBCUTANEOUS

## 2018-12-13 MED ORDER — ONDANSETRON HCL 4 MG/2ML IJ SOLN: 4.0000 mg | Freq: Once | INTRAMUSCULAR | Status: DC | PRN

## 2018-12-13 MED ORDER — BUPIVACAINE LIPOSOME 1.3 % IJ SUSP
INTRAMUSCULAR | Status: AC
  Filled 2018-12-13: qty 20

## 2018-12-13 MED ORDER — SUCCINYLCHOLINE CHLORIDE 20 MG/ML IJ SOLN
INTRAMUSCULAR | Status: DC | PRN
  Administered 2018-12-13: 30 mg via INTRAVENOUS
  Administered 2018-12-13: 100 mg via INTRAVENOUS

## 2018-12-13 MED ORDER — ROPIVACAINE HCL 5 MG/ML IJ SOLN
INTRAMUSCULAR | Status: AC
  Filled 2018-12-13: qty 30

## 2018-12-13 MED ORDER — FENTANYL CITRATE (PF) 100 MCG/2ML IJ SOLN
50.0000 ug | Freq: Once | INTRAMUSCULAR | Status: AC
  Administered 2018-12-13: 09:00:00 50 ug via INTRAVENOUS

## 2018-12-13 MED ORDER — PROPOFOL 10 MG/ML IV BOLUS
INTRAVENOUS | Status: DC | PRN
  Administered 2018-12-13: 140 mg via INTRAVENOUS

## 2018-12-13 MED ORDER — LACTATED RINGERS IV SOLN
INTRAVENOUS | Status: DC
  Administered 2018-12-13 – 2018-12-14 (×3): via INTRAVENOUS

## 2018-12-13 MED ORDER — BUPIVACAINE HCL (PF) 0.25 % IJ SOLN
INTRAMUSCULAR | Status: AC
  Filled 2018-12-13: qty 30

## 2018-12-13 MED ORDER — NEOMYCIN-POLYMYXIN B GU 40-200000 IR SOLN
Status: AC
  Filled 2018-12-13: qty 2

## 2018-12-13 MED ORDER — FENTANYL CITRATE (PF) 100 MCG/2ML IJ SOLN
INTRAMUSCULAR | Status: DC | PRN
  Administered 2018-12-13 (×4): 50 ug via INTRAVENOUS

## 2018-12-13 MED ORDER — NEOMYCIN-POLYMYXIN B GU 40-200000 IR SOLN
Status: DC | PRN
  Administered 2018-12-13: 2 mL

## 2018-12-13 MED ORDER — MIDAZOLAM HCL 2 MG/2ML IJ SOLN
INTRAMUSCULAR | Status: AC
  Administered 2018-12-13: 09:00:00 1 mg via INTRAVENOUS
  Filled 2018-12-13: qty 2

## 2018-12-13 MED ORDER — LIDOCAINE HCL (PF) 1 % IJ SOLN
INTRAMUSCULAR | Status: AC
  Filled 2018-12-13: qty 30

## 2018-12-13 SURGICAL SUPPLY — 57 items
BIT DRILL 2.4 AO COUPLING CANN (BIT) ×1 IMPLANT
BIT DRILL 2.5X2.75 QC CALB (BIT) ×1 IMPLANT
BIT DRILL CALIBRATED 2.7 (BIT) ×1 IMPLANT
BLADE SURG 15 STRL LF DISP TIS (BLADE) IMPLANT
BLADE SURG 15 STRL SS (BLADE) ×2
BNDG COHESIVE 4X5 TAN STRL (GAUZE/BANDAGES/DRESSINGS) ×2 IMPLANT
BNDG CONFORM 2 STRL LF (GAUZE/BANDAGES/DRESSINGS) ×2 IMPLANT
BNDG CONFORM 3 STRL LF (GAUZE/BANDAGES/DRESSINGS) ×2 IMPLANT
BNDG ELASTIC 4X5.8 VLCR NS LF (GAUZE/BANDAGES/DRESSINGS) ×2 IMPLANT
BNDG ESMARK 4X12 TAN STRL LF (GAUZE/BANDAGES/DRESSINGS) ×2 IMPLANT
BNDG GAUZE 4.5X4.1 6PLY STRL (MISCELLANEOUS) ×2 IMPLANT
CANISTER SUCT 1200ML W/VALVE (MISCELLANEOUS) ×2 IMPLANT
COVER WAND RF STERILE (DRAPES) ×2 IMPLANT
DRAPE C-ARM XRAY 36X54 (DRAPES) ×2 IMPLANT
DRAPE C-ARMOR (DRAPES) ×1 IMPLANT
DURAPREP 26ML APPLICATOR (WOUND CARE) ×2 IMPLANT
ELECT REM PT RETURN 9FT ADLT (ELECTROSURGICAL) ×2
ELECTRODE REM PT RTRN 9FT ADLT (ELECTROSURGICAL) ×1 IMPLANT
GAUZE SPONGE 4X4 12PLY STRL (GAUZE/BANDAGES/DRESSINGS) ×2 IMPLANT
GAUZE XEROFORM 1X8 LF (GAUZE/BANDAGES/DRESSINGS) ×2 IMPLANT
GLOVE BIO SURGEON STRL SZ7.5 (GLOVE) ×2 IMPLANT
GLOVE INDICATOR 8.0 STRL GRN (GLOVE) ×2 IMPLANT
GOWN STRL REUS W/ TWL LRG LVL3 (GOWN DISPOSABLE) ×2 IMPLANT
GOWN STRL REUS W/TWL LRG LVL3 (GOWN DISPOSABLE) ×2
K-WIRE FIXATION 2.0X6 (WIRE) ×6
KIT TURNOVER KIT A (KITS) ×2 IMPLANT
KWIRE FIXATION 2.0X6 (WIRE) IMPLANT
NDL FILTER BLUNT 18X1 1/2 (NEEDLE) ×1 IMPLANT
NDL HYPO 25X1 1.5 SAFETY (NEEDLE) ×2 IMPLANT
NEEDLE FILTER BLUNT 18X 1/2SAF (NEEDLE) ×1
NEEDLE FILTER BLUNT 18X1 1/2 (NEEDLE) ×1 IMPLANT
NEEDLE HYPO 22GX1.5 SAFETY (NEEDLE) ×2 IMPLANT
NEEDLE HYPO 25X1 1.5 SAFETY (NEEDLE) IMPLANT
NS IRRIG 500ML POUR BTL (IV SOLUTION) ×2 IMPLANT
PACK EXTREMITY ARMC (MISCELLANEOUS) ×2 IMPLANT
PENCIL ELECTRO HAND CTR (MISCELLANEOUS) ×2 IMPLANT
PIN SHANTZ 5MM (PIN) ×1 IMPLANT
PLATE LOCK CALC LG EXTD 2H LT (Plate) ×1 IMPLANT
SCREW CANN PT 4X46 NS (Screw) IMPLANT
SCREW CORT 3.5X40 (Screw) ×3 IMPLANT
SCREW LOCK 3.5X32 DIST TIB (Orthopedic Implant) ×1 IMPLANT
SCREW LOCK CORT STAR 3.5X26 (Screw) ×1 IMPLANT
SCREW LOCK CORT STAR 3.5X30 (Screw) ×1 IMPLANT
SCREW LOCK CORT STAR 3.5X32 (Screw) ×1 IMPLANT
SCREW LP 3.5X38MM (Screw) ×1 IMPLANT
SCREW THREADED 4.0X46MM (Screw) ×2 IMPLANT
SPLINT CAST 1 STEP 4X30 (MISCELLANEOUS) ×2 IMPLANT
SPLINT FAST PLASTER 5X30 (CAST SUPPLIES) ×1
SPLINT PLASTER CAST FAST 5X30 (CAST SUPPLIES) ×1 IMPLANT
STOCKINETTE M/LG 89821 (MISCELLANEOUS) ×2 IMPLANT
STRIP CLOSURE SKIN 1/4X4 (GAUZE/BANDAGES/DRESSINGS) ×2 IMPLANT
SUT VIC AB 2-0 CT1 27 (SUTURE) ×1
SUT VIC AB 2-0 CT1 TAPERPNT 27 (SUTURE) ×1 IMPLANT
SUT VIC AB 4-0 FS2 27 (SUTURE) ×2 IMPLANT
SUT VICRYL AB 3-0 FS1 BRD 27IN (SUTURE) ×2 IMPLANT
SYR 10ML LL (SYRINGE) ×4 IMPLANT
SYR 50ML LL SCALE MARK (SYRINGE) ×2 IMPLANT

## 2018-12-13 NOTE — Anesthesia Procedure Notes (Signed)
Anesthesia Regional Block: Popliteal block   Pre-Anesthetic Checklist: ,, timeout performed, Correct Patient, Correct Site, Correct Laterality, Correct Procedure, Correct Position, site marked, Risks and benefits discussed,  Surgical consent,  Pre-op evaluation,  At surgeon's request and post-op pain management  Laterality: Lower and Left  Prep: chloraprep       Needles:  Injection technique: Single-shot  Needle Type: Echogenic Needle     Needle Length: 9cm  Needle Gauge: 21     Additional Needles:   Procedures:,,,, ultrasound used (permanent image in chart),,,,  Narrative:  Start time: 12/13/2018 9:15 AM End time: 12/13/2018 9:23 AM Injection made incrementally with aspirations every 5 mL.  Performed by: Personally  Anesthesiologist: Martha Clan, MD  Additional Notes: Patient consented for risk and benefits of nerve block including but not limited to nerve damage, failed block, bleeding and infection.  Patient voiced understanding.  Functioning IV was confirmed and monitors were applied.  A echogenic needle was used. Sterile prep,hand hygiene and sterile gloves were used. Minimal sedation used for procedure.   No paresthesia endorsed by patient during the procedure.  Negative aspiration and negative test dose prior to incremental administration of local anesthetic. The patient tolerated the procedure well with no immediate complications.

## 2018-12-13 NOTE — Progress Notes (Signed)
[place] at [place] NAME: Randall [name]    MR#:  [id number]  DATE OF BIRTH:  17-Aug-1950  SUBJECTIVE:   Patient here after falling from scaffold while at work. He has fracture calcaneal and elbow. Complains of pain Polar care placed on Left foot REVIEW OF SYSTEMS:   Review of Systems  Constitutional: Negative for chills, fever and weight loss.  HENT: Negative for ear discharge, ear pain and nosebleeds.   Eyes: Negative for blurred vision, pain and discharge.  Respiratory: Negative for sputum production, shortness of breath, wheezing and stridor.   Cardiovascular: Negative for chest pain, palpitations, orthopnea and PND.  Gastrointestinal: Negative for abdominal pain, diarrhea, nausea and vomiting.  Genitourinary: Negative for frequency and urgency.  Musculoskeletal: Positive for joint pain. Negative for back pain.  Neurological: Positive for weakness. Negative for sensory change, speech change and focal weakness.  Psychiatric/Behavioral: Negative for depression and hallucinations. The patient is not nervous/anxious.    Tolerating Diet: yes Tolerating PT: home (per pt request)  DRUG ALLERGIES:   Allergies  Allergen Reactions  . Quetiapine Other (See Comments)    Suicidal thoughts    VITALS:  Blood pressure 111/73, pulse 70, temperature 97.8 F (36.6 C), temperature source Tympanic, resp. rate 16, height 5\' 7"  (1.702 m), weight 79.4 kg, SpO2 100 %.  PHYSICAL EXAMINATION:   Physical Exam  GENERAL:  68 y.o.-year-old patient lying in the bed with no acute distress.  EYES: Pupils equal, round, reactive to light and accommodation. No scleral icterus. Extraocular muscles intact.  HEENT: Head atraumatic, normocephalic. Oropharynx and nasopharynx clear.  NECK:  Supple, no jugular venous distention. No thyroid enlargement, no tenderness.  LUNGS: Normal breath sounds bilaterally, no wheezing, rales, rhonchi. No use of accessory  muscles of respiration.  CARDIOVASCULAR: S1, S2 normal. No murmurs, rubs, or gallops.  ABDOMEN: Soft, nontender, nondistended. Bowel sounds present. No organomegaly or mass.  EXTREMITIES: No cyanosis, clubbing or edema b/l.   Left lower extremity cast present. NEUROLOGIC: Cranial nerves II through XII are intact. No focal Motor or sensory deficits b/l.   PSYCHIATRIC:  patient is alert and oriented x 3.  SKIN: No obvious rash, lesion, or ulcer.   LABORATORY PANEL:  CBC Recent Labs  Lab 12/11/18 0604  WBC 8.6  HGB 12.3*  HCT 36.6*  PLT 204    Chemistries  Recent Labs  Lab 12/11/18 0604  NA 138  K 3.7  CL 104  CO2 24  GLUCOSE 108*  BUN 20  CREATININE 0.73  CALCIUM 8.7*  AST 22  ALT 17  ALKPHOS 40  BILITOT 1.2   Cardiac Enzymes No results for input(s): TROPONINI in the last 168 hours. RADIOLOGY:  [place] Or Nerve Block-image Only ([facility])  Result Date: 12/13/2018 There is no interpretation for this exam.  This order is for images obtained during a surgical procedure.  Please See "Surgeries" Tab for more information regarding the procedure.   ASSESSMENT AND PLAN:  Randall [name]  is a 68 y.o. 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  X-rays have revealed left calcaneal fracture and left radial neck fracture  #Acute left foot pain from left calcaneal fracture -Podiatry consult with  Dr. Kennedy Bucker splint placement and now scheduled for ORIF today  Pt is at low risk for surgery -Pain management as needed -CT head is negative  #Left radial neck fracture -Consult placed to orthopedics Dr.  Signa Kell-- no sling required. Patient can do his routine activity.  #Right upper extremity/shoulder rotator cuff tear. Patient will follow-up with Dr. Allena Katz to see if he wants to consider surgical option. This can be done as outpatient. -Pain management as needed  #Leukocytosis probably reactive Patient is  afebrile  CSW/CM for d/c planning-- patient prefers going home with home health PT. DME wheelchair ordered  CODE STATUS: full  DVT Prophylaxis: lovenox to be started after surgery  TOTAL TIME TAKING CARE OF THIS PATIENT: *30* minutes.  >50% time spent on counselling and coordination of care  POSSIBLE D/C IN *1-2* DAYS, DEPENDING ON CLINICAL CONDITION.  Note: This dictation was prepared with Dragon dictation along with smaller phrase technology. Any transcriptional errors that result from this process are unintentional.  Enedina Finner M.D on 12/13/2018 at 11:55 AM  Between 7am to 6pm - Pager - [phone]  After 6pm go to [email or web address] - Social research officer, government  [employer]  [facility]  Office  [phone]  CC: Primary care physician; Randall [name], MDPatient ID: Randall [name]   DOB: 1950-04-28, 68 y.o.   MRN: [id number]

## 2018-12-13 NOTE — Anesthesia Post-op Follow-up Note (Signed)
Anesthesia QCDR form completed.        

## 2018-12-13 NOTE — Op Note (Signed)
Operative note   Surgeon:Taniela Feltus Lawyer: None    Preop diagnosis: Left calcaneal fracture    Postop diagnosis: Same    Procedure: ORIF left calcaneal fracture    EBL: Minimal    Anesthesia:local, regional and general.  Patient had a preop popliteal block placed.  This was supplemented at the end of the case with 10 cc of 0.25% bupivacaine with epinephrine and 20 cc of Exparel long-acting anesthetic.    Hemostasis: Thigh tourniquet inflated to 250 mmHg for 115 minutes    Specimen: None    Complications: None    Operative indications:Randall [name] is an 68 y.o. that presents today for surgical intervention.  The risks/benefits/alternatives/complications have been discussed and consent has been given.    Procedure:  Patient was brought into the OR and placed on the operating table in thelateral position. After anesthesia was obtained theleft lower extremity was prepped and draped in usual sterile fashion.  Attention was directed to the lateral aspect of the left heel where a sinus tarsi incision was performed.  Sharp and blunt dissection carried down to the sinus tarsi.  The lateral wall of the calcaneus was then exposed as well as the posterior aspect of the calcaneus.  The subtalar joint was visualized well.  At this point the lateral posterior facet fragment was noted and impacted into the calcaneal body.  The calcaneus was disimpacted after a Schanz plan was driven into the lateral aspect of the calcaneus.  I was then able to reposition the lateral wall of the posterior facet into an anatomically aligned position.  This was visualized intraoperatively and keyed in nicely.  Initially this was stabilized with a K wire.  A 4.0 mm cannulated screw was driven from lateral to medial for initial stabilization.  This was later removed at the end of the case after the plate was fixated to the lateral calcaneus.  At this time a lateral calcaneal plate was placed.  This was from the  [employer] calcaneal set.  This was an anatomic plate with a 2 hole posterior arm and placed to the sinus tarsi approach.  The sustentacular screw was initially placed.  This was a compression screw.  Next a compression screw was placed both posterior superior hole as well as the distal lateral proximal hole.  The remaining screw holes were then filled with locking screws.  Good anatomic alignment was noted in all views.  The wound was flushed with copious amounts of irrigation.  The subtalar joint was well maintained and anatomic.  The varus position of the calcaneus had been removed with the disimpaction and placement of the lateral plate.  The wound was then closed with a 2-0 and 3-0 Vicryl as well as a nylon for all superficial skin areas.  The area was then infiltrated with the 2-1 mixture of Exparel to [place].  A bulky sterile dressing was applied and patient was then placed in a posterior splint.    Patient tolerated the procedure and anesthesia well.  Was transported from the OR to the PACU with all vital signs stable and vascular status intact. To be discharged per routine protocol.  Will return to the orthopedic floor and possible discharge tomorrow.

## 2018-12-13 NOTE — Progress Notes (Signed)
PT Cancellation Note  Patient Details Name: RAMA MCCLINTOCK MRN: [id number] DOB: 1950/05/11   Cancelled Treatment:    Reason Eval/Treat Not Completed: Patient at procedure or test/unavailable.  Pt currently off unit and not available for PT session (per notes plan for surgery today).  Anticipate may need new PT order s/p surgery per PT protocol.    Leitha Bleak, PT 12/13/18, 8:48 AM [phone]

## 2018-12-13 NOTE — TOC Progression Note (Signed)
Transition of Care [facility]) - Progression Note    Patient Details  Name: ELRIC TIRADO MRN: [id number] Date of Birth: September 11, 1950  [facility]) CM/SW [place], RN Phone Number: 12/13/2018, 1:53 PM  Clinical Narrative:     Spoke with the patient and he would like to go to Rehab, he has realized that he is unable to go to home alone at this time We reviewed the bed offers in detail, he asked to think about it and will let me know. I left the Medicare list with him and the different facilities that has a bed offer available.  Expected Discharge Plan: Home/Self Care Barriers to Discharge: Continued Medical Work up  Expected Discharge Plan and Services Expected Discharge Plan: Home/Self Care   Discharge Planning Services: CM Consult   Living arrangements for the past 2 months: Single Family Home                 DME Arranged: Wheelchair manual DME Agency: [employer] Date DME Agency Contacted: 12/12/18 Time DME Agency Contacted: [phone] Representative spoke with at DME Agency: [place]: NA           Social Determinants of Health ([place]) Interventions    Readmission Risk Interventions No flowsheet data found.

## 2018-12-13 NOTE — Anesthesia Postprocedure Evaluation (Signed)
Anesthesia Post Note  Patient: Randall [name]  Procedure(s) Performed: OPEN REDUCTION INTERNAL FIXATION (ORIF) CALCANEOUS FRACTURE, LEFT (Left )  Patient location during evaluation: PACU Anesthesia Type: General Level of consciousness: awake and alert Pain management: pain level controlled Vital Signs Assessment: post-procedure vital signs reviewed and stable Respiratory status: spontaneous breathing, nonlabored ventilation, respiratory function stable and patient connected to nasal cannula oxygen Cardiovascular status: blood pressure returned to baseline and stable Postop Assessment: no apparent nausea or vomiting Anesthetic complications: no     Last Vitals:  Vitals:   12/13/18 1430 12/13/18 1535  BP: 99/81 104/68  Pulse: 81 82  Resp:    Temp:  36.9 C  SpO2: 99% 99%    Last Pain:  Vitals:   12/13/18 1535  TempSrc: Oral  PainSc:                  Martha Clan

## 2018-12-13 NOTE — Progress Notes (Signed)
Patient ID: Randall [name], male   DOB: 28-Sep-1950, 68 y.o.   MRN: [id number]  Pt now has decided to go to rehab. I have ordered repeat COVID today

## 2018-12-13 NOTE — Anesthesia Preprocedure Evaluation (Addendum)
Anesthesia Evaluation  Patient identified by MRN, date of birth, ID band Patient awake    Reviewed: Allergy & Precautions, H&P , NPO status , Patient's Chart, lab work & pertinent test results, reviewed documented beta blocker date and time   History of Anesthesia Complications Negative for: history of anesthetic complications  Airway Mallampati: II  TM Distance: >3 FB Neck ROM: full    Dental  (+) Dental Advidsory Given, Edentulous Upper, Edentulous Lower   Pulmonary neg shortness of breath, COPD (mild), neg recent URI, former smoker,    Pulmonary exam normal        Cardiovascular Exercise Tolerance: Good negative cardio ROS Normal cardiovascular exam     Neuro/Psych negative neurological ROS  negative psych ROS   GI/Hepatic negative GI ROS, Neg liver ROS,   Endo/Other  negative endocrine ROS  Renal/GU negative Renal ROS  negative genitourinary   Musculoskeletal   Abdominal   Peds  Hematology negative hematology ROS (+)   Anesthesia Other Findings History reviewed. No pertinent past medical history.   Reproductive/Obstetrics negative OB ROS                             Anesthesia Physical Anesthesia Plan  ASA: II  Anesthesia Plan: General   Post-op Pain Management: GA combined w/ Regional for post-op pain   Induction: Intravenous  PONV Risk Score and Plan: 2 and Ondansetron, Dexamethasone and Treatment may vary due to age or medical condition  Airway Management Planned: LMA  Additional Equipment:   Intra-op Plan:   Post-operative Plan: Extubation in OR  Informed Consent: I have reviewed the patients History and Physical, chart, labs and discussed the procedure including the risks, benefits and alternatives for the proposed anesthesia with the patient or authorized representative who has indicated his/her understanding and acceptance.     Dental Advisory Given  Plan  Discussed with: Anesthesiologist, CRNA and Surgeon  Anesthesia Plan Comments:        Anesthesia Quick Evaluation

## 2018-12-13 NOTE — Anesthesia Procedure Notes (Signed)
Procedure Name: Intubation Performed by: Fredderick Phenix, CRNA Pre-anesthesia Checklist: Patient identified, Emergency Drugs available, Suction available and Patient being monitored Patient Re-evaluated:Patient Re-evaluated prior to induction Oxygen Delivery Method: Circle system utilized Preoxygenation: Pre-oxygenation with 100% oxygen Induction Type: IV induction Ventilation: Mask ventilation without difficulty Laryngoscope Size: Mac and 3 Grade View: Grade I Tube type: Oral Tube size: 7.5 mm Number of attempts: 1 Airway Equipment and Method: Stylet and Oral airway Placement Confirmation: ETT inserted through vocal cords under direct vision,  positive ETCO2 and breath sounds checked- equal and bilateral Tube secured with: Tape Dental Injury: Teeth and Oropharynx as per pre-operative assessment

## 2018-12-13 NOTE — Transfer of Care (Signed)
Immediate Anesthesia Transfer of Care Note  Patient: Randall [name]  Procedure(s) Performed: OPEN REDUCTION INTERNAL FIXATION (ORIF) CALCANEOUS FRACTURE, LEFT (Left )  Patient Location: PACU  Anesthesia Type:General  Level of Consciousness: awake, alert  and oriented  Airway & Oxygen Therapy: Patient Spontanous Breathing and Patient connected to face mask oxygen  Post-op Assessment: Report given to RN  Post vital signs: Reviewed and stable  Last Vitals:  Vitals Value Taken Time  BP 114/55 12/13/18 1231  Temp 37.4 C 12/13/18 1230  Pulse 89 12/13/18 1235  Resp 16 12/13/18 1235  SpO2 100 % 12/13/18 1235  Vitals shown include unvalidated device data.  Last Pain:  Vitals:   12/13/18 1230  TempSrc:   PainSc: 0-No pain      Patients Stated Pain Goal: 3 (28/00/34 9179)  Complications: No apparent anesthesia complications

## 2018-12-13 NOTE — TOC Progression Note (Signed)
Transition of Care [facility]) - Progression Note    Patient Details  Name: Randall [name] MRN: [id number] Date of Birth: 1950-04-16  Transition of Care [facility]) CM/SW [place], RN Phone Number: 12/13/2018, 3:13 PM  Clinical Narrative:    Reviewed the bed offers with the patient in detail, he decided he wants to go to [facility], I notified Otila Kluver with [facility] that he is accepting the bed offer, they will need a new COvid test   Expected Discharge Plan: Home/Self Care Barriers to Discharge: Continued Medical Work up  Expected Discharge Plan and Services Expected Discharge Plan: Home/Self Care   Discharge Planning Services: CM Consult   Living arrangements for the past 2 months: Single Family Home                 DME Arranged: Wheelchair manual DME Agency: [employer] Date DME Agency Contacted: 12/12/18 Time DME Agency Contacted: [phone] Representative spoke with at DME Agency: [place] HH Arranged: NA           Social Determinants of Health ([place]) Interventions    Readmission Risk Interventions No flowsheet data found.

## 2018-12-14 LAB — SARS CORONAVIRUS 2 (TAT 6-24 HRS): SARS Coronavirus 2: NEGATIVE

## 2018-12-14 MED ORDER — OXYCODONE-ACETAMINOPHEN 5-325 MG PO TABS
1.0000 | ORAL_TABLET | ORAL | Status: DC | PRN
  Administered 2018-12-14: 1 via ORAL
  Filled 2018-12-14: qty 1

## 2018-12-14 MED ORDER — OXYCODONE-ACETAMINOPHEN 5-325 MG PO TABS
1.0000 | ORAL_TABLET | Freq: Four times a day (QID) | ORAL | Status: DC | PRN
  Administered 2018-12-14 – 2018-12-16 (×9): 2 via ORAL
  Filled 2018-12-14 (×9): qty 2

## 2018-12-14 NOTE — Progress Notes (Signed)
PT Cancellation Note  Patient Details Name: Randall [name] MRN: [id number] DOB: 1950/12/14   Cancelled Treatment:    Reason Eval/Treat Not Completed: Patient declined, no reason specified;Other (comment)(Patient consult received and reviewed. Patient declined PT today due to pain, stated he will try tomorrow but not today. Will attempt again at later time/date.)  Janna Arch, PT, DPT   12/14/2018, 3:35 PM

## 2018-12-14 NOTE — Progress Notes (Signed)
[employer] [facility] [facility] [place] at [facility]   PATIENT NAME: Randall [name]    MR#:  [id number]  DATE OF BIRTH:  May 03, 1950  SUBJECTIVE:  CHIEF COMPLAINT:   Chief Complaint  Patient presents with  . Fall   -Fall and left calcaneal fracture.  Complains of significant pain to the surgical site at left calcaneus.  Also has right shoulder pain secondary to rotator cuff tear from the same fall.  REVIEW OF SYSTEMS:  Review of Systems  Constitutional: Negative for chills, fever and malaise/fatigue.  HENT: Negative for congestion, ear discharge, hearing loss and nosebleeds.   Eyes: Negative for blurred vision and double vision.  Respiratory: Negative for cough, shortness of breath and wheezing.   Cardiovascular: Negative for chest pain and palpitations.  Gastrointestinal: Negative for abdominal pain, constipation, diarrhea, nausea and vomiting.  Genitourinary: Negative for dysuria.  Musculoskeletal: Positive for joint pain.  Neurological: Negative for dizziness, focal weakness, seizures and headaches.    DRUG ALLERGIES:   Allergies  Allergen Reactions  . Quetiapine Other (See Comments)    Suicidal thoughts    VITALS:  Blood pressure (!) 153/72, pulse 85, temperature 98.3 F (36.8 C), resp. rate 18, height 5\' 7"  (1.702 m), weight 79.4 kg, SpO2 96 %.  PHYSICAL EXAMINATION:  Physical Exam  GENERAL:  68 y.o.-year-old patient lying in the bed with no acute distress.  EYES: Pupils equal, round, reactive to light and accommodation. No scleral icterus. Extraocular muscles intact.  HEENT: Head atraumatic, normocephalic. Oropharynx and nasopharynx clear.  NECK:  Supple, no jugular venous distention. No thyroid enlargement, no tenderness.  LUNGS: Normal breath sounds bilaterally, no wheezing, rales,rhonchi or crepitation. No use of accessory muscles of respiration.  CARDIOVASCULAR: S1, S2 normal. No murmurs, rubs, or gallops.  ABDOMEN: Soft, nontender, nondistended. Bowel  sounds present. No organomegaly or mass.  EXTREMITIES: No   cyanosis, or clubbing.  Right shoulder pain.  Left leg in a cast. NEUROLOGIC: Cranial nerves II through XII are intact. Muscle strength 5/5 in all extremities. Sensation intact. Gait not checked.  PSYCHIATRIC: The patient is alert and oriented x 3.  SKIN: No obvious rash, lesion, or ulcer.    LABORATORY PANEL:   CBC Recent Labs  Lab 12/11/18 0604  WBC 8.6  HGB 12.3*  HCT 36.6*  PLT 204   ------------------------------------------------------------------------------------------------------------------  Chemistries  Recent Labs  Lab 12/11/18 0604  NA 138  K 3.7  CL 104  CO2 24  GLUCOSE 108*  BUN 20  CREATININE 0.73  CALCIUM 8.7*  AST 22  ALT 17  ALKPHOS 40  BILITOT 1.2   ------------------------------------------------------------------------------------------------------------------  Cardiac Enzymes No results for input(s): TROPONINI in the last 168 hours. ------------------------------------------------------------------------------------------------------------------  RADIOLOGY:  Dg Os Calcis Left  Result Date: 12/13/2018 CLINICAL DATA:  Portable fluoroscopic imaging provided for ORIF of left calcaneal fractures EXAM: DG C-ARM 1-60 MIN; LEFT OS CALCIS - 2+ VIEW COMPARISON:  12/10/2018 FINDINGS: Images show reduction of the comminuted calcaneal fracture with fixation with a lateral plate and multiple screws. Fractures well aligned. Orthopedic hardware appears well seated. IMPRESSION: Portable operative imaging provided for ORIF left calcaneal fractures. Fractures appear well aligned. Electronically Signed   By: 12/12/2018 M.D.   On: 12/13/2018 12:35   Dg C-arm 1-60 Min  Result Date: 12/13/2018 CLINICAL DATA:  Portable fluoroscopic imaging provided for ORIF of left calcaneal fractures EXAM: DG C-ARM 1-60 MIN; LEFT OS CALCIS - 2+ VIEW COMPARISON:  12/10/2018 FINDINGS: Images show reduction of the comminuted  calcaneal  fracture with fixation with a lateral plate and multiple screws. Fractures well aligned. Orthopedic hardware appears well seated. IMPRESSION: Portable operative imaging provided for ORIF left calcaneal fractures. Fractures appear well aligned. Electronically Signed   By: Lajean Manes M.D.   On: 12/13/2018 12:35   Dg C-arm 1-60 Min  Result Date: 12/13/2018 CLINICAL DATA:  Portable fluoroscopic imaging provided for ORIF of left calcaneal fractures EXAM: DG C-ARM 1-60 MIN; LEFT OS CALCIS - 2+ VIEW COMPARISON:  12/10/2018 FINDINGS: Images show reduction of the comminuted calcaneal fracture with fixation with a lateral plate and multiple screws. Fractures well aligned. Orthopedic hardware appears well seated. IMPRESSION: Portable operative imaging provided for ORIF left calcaneal fractures. Fractures appear well aligned. Electronically Signed   By: Lajean Manes M.D.   On: 12/13/2018 12:35   [place] Or Nerve Block-image Only ([facility])  Result Date: 12/13/2018 There is no interpretation for this exam.  This order is for images obtained during a surgical procedure.  Please See "Surgeries" Tab for more information regarding the procedure.    EKG:   Orders placed or performed during the hospital encounter of 12/10/18  . EKG 12-Lead  . 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 X-rays have revealed left calcaneal fracture and left radial neck fracture  #Acute left foot pain from left calcaneal fracture -Podiatry consult appreciated, s/p ORIF - POD # 1 to day, need pain meds adjustment today for better pain control -CT head is negative -Possible discharge to rehab tomorrow  #Left radial neck fracture -Appreciate Ortho input-no sling required.  Nonsurgical. -Patient can resume his routine activity.  #Right upper extremity/shoulder rotator cuff tear.  -  outpt follow up with ortho.  Conservative management 11 -Pain management as needed  #Leukocytosis probably reactive Patient is afebrile.  WBC normalized.  Not on antibiotic  Awaiting patient to get into rehab     All the records are reviewed and case discussed with Care Management/Social Workerr. Management plans discussed with the patient, family and they are in agreement.  CODE STATUS: Full code  TOTAL TIME TAKING CARE OF THIS PATIENT: 37 minutes.   POSSIBLE D/C IN 1-2 DAYS, DEPENDING ON CLINICAL CONDITION.   Gladstone Lighter M.D on 12/14/2018 at 1:06 PM  Between 7am to 6pm - Pager - [phone]  After 6pm go to [email or web address] - password [email or web address] [facility]  Office  [phone]  CC: Primary care physician; Donnie Coffin, MD

## 2018-12-14 NOTE — Progress Notes (Signed)
PT Cancellation Note  Patient Details Name: DAUNTE OESTREICH MRN: [id number] DOB: 1950-10-02   Cancelled Treatment:    Reason Eval/Treat Not Completed: Other (comment)(Awaiting continuation of orders s/p operation. Nursing notified physician, awaiting at this time.)  Janna Arch, PT, DPT   12/14/2018, 11:22 AM

## 2018-12-14 NOTE — Progress Notes (Signed)
Daily Progress Note   Subjective  - 1 Day Post-Op  F/u left ORIF calcaneus.  C/O significant pain today at surgical site.  Objective Vitals:   12/13/18 2002 12/13/18 2351 12/14/18 0412 12/14/18 0735  BP: 130/75 124/71 127/64 (!) 153/72  Pulse: 98 89 87 85  Resp: 19 19 19 18   Temp: 98.5 F (36.9 C) 97.8 F (36.6 C) 99.1 F (37.3 C) 98.3 F (36.8 C)  TempSrc: Oral Oral Oral   SpO2: 95% 99% 99% 96%  Weight:      Height:        Physical Exam: Incision well coapted.  Minimal bleeding. Edema is mild.  Laboratory CBC    Component Value Date/Time   WBC 8.6 12/11/2018 0604   HGB 12.3 (L) 12/11/2018 0604   HGB 13.3 01/31/2013 1523   HCT 36.6 (L) 12/11/2018 0604   HCT 38.7 (L) 01/31/2013 1523   PLT 204 12/11/2018 0604   PLT 311 01/31/2013 1523    BMET    Component Value Date/Time   NA 138 12/11/2018 0604   NA 136 01/31/2013 1523   K 3.7 12/11/2018 0604   K 3.2 (L) 01/31/2013 1523   CL 104 12/11/2018 0604   CL 104 01/31/2013 1523   CO2 24 12/11/2018 0604   CO2 25 01/31/2013 1523   GLUCOSE 108 (H) 12/11/2018 0604   GLUCOSE 83 01/31/2013 1523   BUN 20 12/11/2018 0604   BUN 9 01/31/2013 1523   CREATININE 0.73 12/11/2018 0604   CREATININE 0.87 01/31/2013 1523   CALCIUM 8.7 (L) 12/11/2018 0604   CALCIUM 8.9 01/31/2013 1523   GFRNONAA >60 12/11/2018 0604   GFRNONAA >60 01/31/2013 1523   GFRAA >60 12/11/2018 0604   GFRAA >60 01/31/2013 1523    Assessment/Planning: Left calcaneal fracture   Will try to get pain under controll.  Increase po pain meds to q 6 and supplement with IV morphine as needed.  C/W NWB to left foot.  Incision looks great.  Splint and dressing changed.  F/u with me Thursday for dressing change.  Once pain under controll ok to d/c from podiatry standpoint.    Samara Deist A  12/14/2018, 12:07 PM

## 2018-12-14 NOTE — Progress Notes (Signed)
OT Cancellation Note  Patient Details Name: Randall [name] MRN: [id number] DOB: 1951-01-25   Cancelled Treatment:    Reason Eval/Treat Not Completed: Pain limiting ability to participate OT checks on pt for eval once new order acquired, Pt with c/o severe  Pain. OT to f/u for eval when pt is more able to tolerate.  Jake Church Leveda Kendrix 12/14/2018, 4:29 PM

## 2018-12-14 NOTE — Progress Notes (Signed)
OT Cancellation Note  Patient Details Name: Randall [name] MRN: [id number] DOB: [birth year]   Cancelled Treatment:    Reason Eval/Treat Not Completed: Other (comment) OT completed chart review noting that pt's OT order is from 12/11/18 and that pt underwent sx with GA on 12/13/18, per protocol, new OT order is required. Will complete current order and await new order to evaluate patient.   Jake Church Laura Caldas 12/14/2018, 9:55 AM

## 2018-12-15 NOTE — Progress Notes (Signed)
[employer] - [place] at [facility]   PATIENT NAME: Randall [name]    MR#:  [id number]  DATE OF BIRTH:  [birth year]  SUBJECTIVE:  CHIEF COMPLAINT:   Chief Complaint  Patient presents with  . Fall   -Fall and left calcaneal fracture.  Left foot pain is improved today.  Complains of both arm pain and tingling down the arms.  Refusing physical therapy.  Plan to discharge to rehab tomorrow.  REVIEW OF SYSTEMS:  Review of Systems  Constitutional: Negative for chills, fever and malaise/fatigue.  HENT: Negative for congestion, ear discharge, hearing loss and nosebleeds.   Eyes: Negative for blurred vision and double vision.  Respiratory: Negative for cough, shortness of breath and wheezing.   Cardiovascular: Negative for chest pain and palpitations.  Gastrointestinal: Negative for abdominal pain, constipation, diarrhea, nausea and vomiting.  Genitourinary: Negative for dysuria.  Musculoskeletal: Positive for joint pain.  Neurological: Negative for dizziness, focal weakness, seizures and headaches.    DRUG ALLERGIES:   Allergies  Allergen Reactions  . Quetiapine Other (See Comments)    Suicidal thoughts    VITALS:  Blood pressure 125/67, pulse 75, temperature 98.3 F (36.8 C), resp. rate 18, height 5\' 7"  (1.702 m), weight 79.4 kg, SpO2 98 %.  PHYSICAL EXAMINATION:  Physical Exam  GENERAL:  68 y.o.-year-old patient lying in the bed with no acute distress.  EYES: Pupils equal, round, reactive to light and accommodation. No scleral icterus. Extraocular muscles intact.  HEENT: Head atraumatic, normocephalic. Oropharynx and nasopharynx clear.  NECK:  Supple, no jugular venous distention. No thyroid enlargement, no tenderness.  LUNGS: Normal breath sounds bilaterally, no wheezing, rales,rhonchi or crepitation. No use of accessory muscles of respiration.  CARDIOVASCULAR: S1, S2 normal. No murmurs, rubs, or gallops.  ABDOMEN: Soft, nontender, nondistended. Bowel  sounds present. No organomegaly or mass.  EXTREMITIES: No   cyanosis, or clubbing.  Right shoulder pain.  Left leg in a cast.  Right arm limited mobility in abduction and external rotation. NEUROLOGIC: Cranial nerves II through XII are intact. Muscle strength 5/5 in all extremities. Sensation intact. Gait not checked.  PSYCHIATRIC: The patient is alert and oriented x 3.  SKIN: No obvious rash, lesion, or ulcer.    LABORATORY PANEL:   CBC Recent Labs  Lab 12/11/18 0604  WBC 8.6  HGB 12.3*  HCT 36.6*  PLT 204   ------------------------------------------------------------------------------------------------------------------  Chemistries  Recent Labs  Lab 12/11/18 0604  NA 138  K 3.7  CL 104  CO2 24  GLUCOSE 108*  BUN 20  CREATININE 0.73  CALCIUM 8.7*  AST 22  ALT 17  ALKPHOS 40  BILITOT 1.2   ------------------------------------------------------------------------------------------------------------------  Cardiac Enzymes No results for input(s): TROPONINI in the last 168 hours. ------------------------------------------------------------------------------------------------------------------  RADIOLOGY:  No results found.  EKG:   Orders placed or performed during the hospital encounter of 12/10/18  . EKG 12-Lead  . 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 X-rays have revealed left calcaneal fracture and left radial neck fracture  #Acute left foot pain from left calcaneal fracture -Podiatry consult appreciated, s/p ORIF - POD # 2 today, continue pain control. -CT head is negative -Possible discharge to rehab tomorrow  #Left radial neck fracture -Appreciate Ortho input-no sling required.  Nonsurgical. -Patient can resume his routine activity.  #Right upper extremity/shoulder rotator cuff tear.  - outpt  follow up  with ortho.  Conservative management for now.  We will also consult occupational therapy. -Pain management as needed  #Leukocytosis probably reactive Patient is afebrile.  WBC normalized.  Not on antibiotic  Awaiting patient to get into rehab -COVID-19 test ordered again     All the records are reviewed and case discussed with Care Management/Social Workerr. Management plans discussed with the patient, family and they are in agreement.  CODE STATUS: Full code  TOTAL TIME TAKING CARE OF THIS PATIENT: 37 minutes.   POSSIBLE D/C tomorrow, DEPENDING ON CLINICAL CONDITION.   Gladstone Lighter M.D on 12/15/2018 at 12:45 PM  Between 7am to 6pm - Pager - [phone]  After 6pm go to [email or web address] - password [email or web address] [email or web address]  Office  [phone]  CC: Primary care physician; Donnie Coffin, MD

## 2018-12-15 NOTE — Progress Notes (Signed)
Pt refusing laxative

## 2018-12-15 NOTE — Plan of Care (Signed)
  Problem: Health Behavior/Discharge Planning: Goal: Ability to manage health-related needs will improve Outcome: Progressing   Problem: Clinical Measurements: Goal: Ability to maintain clinical measurements within normal limits will improve Outcome: Progressing Goal: Will remain free from infection Outcome: Progressing Goal: Diagnostic test results will improve Outcome: Progressing   

## 2018-12-15 NOTE — Progress Notes (Signed)
PT Cancellation Note  Patient Details Name: Randall [name] MRN: [id number] DOB: [birth year]   Cancelled Treatment:    Reason Eval/Treat Not Completed: Patient declined, no reason specified;Other (comment), Patient reports that he just tried to get up himself and he cant put any weight on his foot and his hands are not able to hold him up. He does not want to try again to get up or have PT today.    [address], [place], [place] DPT 12/15/2018, 12:23 PM

## 2018-12-16 LAB — BASIC METABOLIC PANEL
Anion gap: 12 (ref 5–15)
BUN: 15 mg/dL (ref 8–23)
CO2: 23 mmol/L (ref 22–32)
Calcium: 9 mg/dL (ref 8.9–10.3)
Chloride: 101 mmol/L (ref 98–111)
Creatinine, Ser: 0.6 mg/dL — ABNORMAL LOW (ref 0.61–1.24)
GFR calc Af Amer: >60 mL/min (ref >60)
GFR calc non Af Amer: >60 mL/min (ref >60)
Glucose, Bld: 102 mg/dL — ABNORMAL HIGH (ref 70–99)
Potassium: 4 mmol/L (ref 3.5–5.1)
Sodium: 136 mmol/L (ref 135–145)

## 2018-12-16 LAB — CBC
HCT: 36 % — ABNORMAL LOW (ref 39.0–52.0)
Hemoglobin: 12 g/dL — ABNORMAL LOW (ref 13.0–17.0)
MCH: 30.8 pg (ref 26.0–34.0)
MCHC: 33.3 g/dL (ref 30.0–36.0)
MCV: 92.3 fL (ref 80.0–100.0)
Platelets: 223 10*3/uL (ref 150–400)
RBC: 3.9 MIL/uL — ABNORMAL LOW (ref 4.22–5.81)
RDW: 12.3 % (ref 11.5–15.5)
WBC: 9.3 10*3/uL (ref 4.0–10.5)
nRBC: 0 % (ref 0.0–0.2)

## 2018-12-16 LAB — SARS CORONAVIRUS 2 (TAT 6-24 HRS): SARS Coronavirus 2: NEGATIVE

## 2018-12-16 MED ORDER — BISACODYL 5 MG PO TBEC
10.0000 mg | DELAYED_RELEASE_TABLET | Freq: Every day | ORAL | Status: DC
  Administered 2018-12-16: 10 mg via ORAL
  Filled 2018-12-16: qty 2

## 2018-12-16 MED ORDER — POLYETHYLENE GLYCOL 3350 17 G PO PACK
17.0000 g | PACK | Freq: Every day | ORAL | Status: DC
  Administered 2018-12-16: 17 g via ORAL
  Filled 2018-12-16: qty 1

## 2018-12-16 MED ORDER — OXYCODONE-ACETAMINOPHEN 5-325 MG PO TABS: 1.0000 | ORAL_TABLET | Freq: Four times a day (QID) | ORAL | 0 refills | Status: DC | PRN

## 2018-12-16 NOTE — Discharge Summary (Signed)
[facility] - [facility]   PATIENT NAME: Randall [name]    MR#:  [id number]  DATE OF BIRTH:  [birth year]  DATE OF ADMISSION:  12/10/2018   ADMITTING PHYSICIAN: Ramonita Lab, MD  DATE OF DISCHARGE:  12/16/18  PRIMARY CARE PHYSICIAN: Emogene Morgan, MD   ADMISSION DIAGNOSIS:   Fall, initial encounter [W19.XXXA]  DISCHARGE DIAGNOSIS:   Active Problems:   Calcaneal fracture   SECONDARY DIAGNOSIS:   History reviewed. No pertinent past medical history.  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 X-rays have revealed left calcaneal fracture and left radial neck fracture  #Acute left foot pain from left calcaneal fracture -Podiatry consult appreciated, s/p ORIF - POD # 3 today, non weight bearing on the left leg now. continue pain control. -CT head is negative -Possible discharge to rehab today - patient might need wheel chair at discharge home  #Left radial neck fracture -Appreciate Ortho input-no sling required.  Nonsurgical. -Patient can resume his routine activity.  #Right upper extremity/shoulder rotator cuff tear.  - outpt follow up with ortho.  Conservative management for now.   also continue occupational therapy. -Pain management as needed  #Leukocytosis - reactive Patient is afebrile.  WBC normalized.  Not on antibiotic  Discharge to rehab today  DISCHARGE CONDITIONS:   Guarded  CONSULTS OBTAINED:   Treatment Team:  Signa Kell, MD  Podiatry consultation by Dr. Ether Griffins  DRUG ALLERGIES:   Allergies  Allergen Reactions   Quetiapine Other (See Comments)    Suicidal thoughts   DISCHARGE MEDICATIONS:   Allergies as of 12/16/2018      Reactions   Quetiapine Other (See Comments)   Suicidal thoughts      Medication List    STOP taking these medications   naproxen 500 MG tablet Commonly known as:  Naprosyn     TAKE these medications   oxyCODONE-acetaminophen 5-325 MG tablet Commonly known as: PERCOCET/ROXICET Take 1-2 tablets by mouth every 6 (six) hours as needed for moderate pain or severe pain.            Durable Medical Equipment  (From admission, onward)         Start     Ordered   12/13/18 1156  For home use only DME wheelchair cushion (seat and back)  Once     12/13/18 1155           DISCHARGE INSTRUCTIONS:   1. PCP f/u in 1-2 weeks 2. Podiatry f/u in 2 weeks  DIET:   Regular diet   ACTIVITY:   Activity as tolerated  OXYGEN:   Home Oxygen: No.  Oxygen Delivery: room air  DISCHARGE LOCATION:   nursing home   If you experience worsening of your admission symptoms, develop shortness of breath, life threatening emergency, suicidal or homicidal thoughts you must seek medical attention immediately by calling 911 or calling your MD immediately  if symptoms less severe.  You Must read complete instructions/literature along with all the possible adverse reactions/side effects for all the Medicines you take and that have been prescribed to you. Take any new Medicines after you have completely understood and accpet all the possible adverse reactions/side effects.   Please note  You were cared for by a hospitalist during your hospital stay. If you have any questions about your discharge medications or the care you received while you were in the  hospital after you are discharged, you can call the unit and asked to speak with the hospitalist on call if the hospitalist that took care of you is not available. Once you are discharged, your primary care physician will handle any further medical issues. Please note that NO REFILLS for any discharge medications will be authorized once you are discharged, as it is imperative that you return to your primary care physician (or establish a relationship with a primary care physician if you do not have one) for your  aftercare needs so that they can reassess your need for medications and monitor your lab values.    On the day of Discharge:  VITAL SIGNS:   Blood pressure 104/70, pulse 74, temperature 98.3 F (36.8 C), temperature source Oral, resp. rate 18, height  (1.702 m), weight 79.4 kg, SpO2 98 %.  PHYSICAL EXAMINATION:    GENERAL:  68 y.o.-year-old patient lying in the bed with no acute distress.  EYES: Pupils equal, round, reactive to light and accommodation. No scleral icterus. Extraocular muscles intact.  HEENT: Head atraumatic, normocephalic. Oropharynx and nasopharynx clear.  NECK:  Supple, no jugular venous distention. No thyroid enlargement, no tenderness.  LUNGS: Normal breath sounds bilaterally, no wheezing, rales,rhonchi or crepitation. No use of accessory muscles of respiration.  CARDIOVASCULAR: S1, S2 normal. No murmurs, rubs, or gallops.  ABDOMEN: Soft, nontender, nondistended. Bowel sounds present. No organomegaly or mass.  EXTREMITIES: No   cyanosis, or clubbing.  Right shoulder pain.  Left leg in a cast.  Right arm limited mobility in abduction and external rotation. NEUROLOGIC: Cranial nerves II through XII are intact. Muscle strength 5/5 in all extremities. Sensation intact. Gait not checked.  PSYCHIATRIC: The patient is alert and oriented x 3.  SKIN: No obvious rash, lesion, or ulcer.   DATA REVIEW:   CBC Recent Labs  Lab 12/16/18 0607  WBC 9.3  HGB 12.0*  HCT 36.0*  PLT 223    Chemistries  Recent Labs  Lab 12/11/18 0604 12/16/18 0607  NA 138 136  K 3.7 4.0  CL 104 101  CO2 24 23  GLUCOSE 108* 102*  BUN 20 15  CREATININE 0.73 0.60*  CALCIUM 8.7* 9.0  AST 22  --   ALT 17  --   ALKPHOS 40  --   BILITOT 1.2  --      Microbiology Results  Results for orders placed or performed during the hospital encounter of 12/10/18  SARS CORONAVIRUS 2 (TAT 6-24 HRS) Nasopharyngeal Nasopharyngeal Swab     Status: None   Collection Time: 12/10/18  5:38 PM    Specimen: Nasopharyngeal Swab  Result Value Ref Range Status   SARS Coronavirus 2 NEGATIVE NEGATIVE Final    Comment: (NOTE) SARS-CoV-2 target nucleic acids are NOT DETECTED. The SARS-CoV-2 RNA is generally detectable in upper and lower respiratory specimens during the acute phase of infection. Negative results do not preclude SARS-CoV-2 infection, do not rule out co-infections with other pathogens, and should not be used as the sole basis for treatment or other patient management decisions. Negative results must be combined with clinical observations, patient history, and epidemiological information. The expected result is Negative. Fact Sheet for Patients: HairSlick.no Fact Sheet for Healthcare Providers: quierodirigir.com This test is not yet approved or cleared by the [place] FDA and  has been authorized for detection and/or diagnosis of SARS-CoV-2 by FDA under an Emergency Use Authorization (EUA). This EUA will remain  in effect (meaning this test can be used)  for the duration of the COVID-19 declaration under Section 56 4(b)(1) of the Act, 21 U.S.C. section 360bbb-3(b)(1), unless the authorization is terminated or revoked sooner. Performed at [facility], [place] [address] [place], [place] [address]   Surgical PCR screen     Status: None   Collection Time: 12/12/18 12:53 PM   Specimen: Nasal Mucosa; Nasal Swab  Result Value Ref Range Status   MRSA, PCR NEGATIVE NEGATIVE Final   Staphylococcus aureus NEGATIVE NEGATIVE Final    Comment: (NOTE) The Xpert SA Assay (FDA approved for NASAL specimens in patients 69 years of age and older), is one component of a comprehensive surveillance program. It is not intended to diagnose infection nor to guide or monitor treatment. Performed at [facility], [place], [place] [address]   SARS CORONAVIRUS 2 (TAT 6-24 HRS) Nasopharyngeal  Nasopharyngeal Swab     Status: None   Collection Time: 12/13/18  8:56 PM   Specimen: Nasopharyngeal Swab  Result Value Ref Range Status   SARS Coronavirus 2 NEGATIVE NEGATIVE Final    Comment: (NOTE) SARS-CoV-2 target nucleic acids are NOT DETECTED. The SARS-CoV-2 RNA is generally detectable in upper and lower respiratory specimens during the acute phase of infection. Negative results do not preclude SARS-CoV-2 infection, do not rule out co-infections with other pathogens, and should not be used as the sole basis for treatment or other patient management decisions. Negative results must be combined with clinical observations, patient history, and epidemiological information. The expected result is Negative. Fact Sheet for Patients: SugarRoll.be Fact Sheet for Healthcare Providers: https://www.woods-mathews.com/ This test is not yet approved or cleared by the [place] FDA and  has been authorized for detection and/or diagnosis of SARS-CoV-2 by FDA under an Emergency Use Authorization (EUA). This EUA will remain  in effect (meaning this test can be used) for the duration of the COVID-19 declaration under Section 56 4(b)(1) of the Act, 21 U.S.C. section 360bbb-3(b)(1), unless the authorization is terminated or revoked sooner. Performed at [facility], [place] [address], [place], [place] [address]   SARS CORONAVIRUS 2 (TAT 6-24 HRS) Nasopharyngeal Nasopharyngeal Swab     Status: None   Collection Time: 12/15/18  1:16 PM   Specimen: Nasopharyngeal Swab  Result Value Ref Range Status   SARS Coronavirus 2 NEGATIVE NEGATIVE Final    Comment: (NOTE) SARS-CoV-2 target nucleic acids are NOT DETECTED. The SARS-CoV-2 RNA is generally detectable in upper and lower respiratory specimens during the acute phase of infection. Negative results do not preclude SARS-CoV-2 infection, do not rule out co-infections with other pathogens, and should not  be used as the sole basis for treatment or other patient management decisions. Negative results must be combined with clinical observations, patient history, and epidemiological information. The expected result is Negative. Fact Sheet for Patients: SugarRoll.be Fact Sheet for Healthcare Providers: https://www.woods-mathews.com/ This test is not yet approved or cleared by the [place] FDA and  has been authorized for detection and/or diagnosis of SARS-CoV-2 by FDA under an Emergency Use Authorization (EUA). This EUA will remain  in effect (meaning this test can be used) for the duration of the COVID-19 declaration under Section 56 4(b)(1) of the Act, 21 U.S.C. section 360bbb-3(b)(1), unless the authorization is terminated or revoked sooner. Performed at [facility], [place] [address], [place], [place] [address]     RADIOLOGY:  No results found.   Management plans discussed with the patient, family and they are in agreement.  CODE STATUS:     Code Status Orders  (From admission, onward)         Start     Ordered   12/10/18 2247  Full code  Continuous     12/10/18 2246        Code Status History    This patient has a current code status but no historical code status.   Advance Care Planning Activity      TOTAL TIME TAKING CARE OF THIS PATIENT: 39 minutes.    Enid Baasadhika Seraphine Gudiel M.D on 12/16/2018 at 12:07 PM  Between 7am to 6pm - Pager - [phone]  After 6pm go to [email or web address] - Scientist, research (life sciences)password EPAS [facility]  [facility] [place] [facility]  Office  [phone]  CC: Primary care physician; Emogene MorganAycock, Ngwe A, MD   Note: This dictation was prepared with Dragon dictation along with smaller phrase technology. Any transcriptional errors that result from this process are unintentional.

## 2018-12-16 NOTE — Progress Notes (Signed)
   12/16/18 1500  Clinical Encounter Type  Visited With Patient  Visit Type Follow-up  Referral From Chaplain   Chaplain received a referral from the patient's nurse. Upon arrival, the patient was resting in bed watching television. He was awake, alert, and oriented. The patient and chaplain engaged in brief conversation about what brought him to the hospital. The patient  Expressed gratitude that his fall was not worse and did not result in more significant injuries. This chaplain joined the patient in this moment of relief. The patient inquired about a wheelchair being provided for him at discharge. This chaplain will inquire with Case Management.

## 2018-12-16 NOTE — Evaluation (Signed)
Occupational Therapy Evaluation Patient Details Name: Randall [name] MRN: [id number] DOB: 26-Feb-1951 Today's Date: 12/16/2018    History of Present Illness Pt is a 68 y.o. male presenting to hospital s/p fall 9/29 from scaffold onto L side.  Imaging showing comminuted displaced fx's of body of L calcaneus involving subtalar joint and possible tiny avulsion fx's posterior process of talus; also possible subtle angulated fx L radial neck and large elbow joint effusion.  H/o R shoulder rotator cuff tears.  Now status post L calcaneal ORIF (10/2) with orders for continued therapy and persistent NWB L LE status.   Clinical Impression   [name] was seen for OT evaluation this date, POD#3 from above surgery. Pt was active and independent in all ADLs/IADLs prior to surgery. 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 Pt is eager to return to PLOF with less pain and improved safety and independence. Pt currently requires minimal assist for LB dressing while in seated position due to pain NWB status through the LLE. Pt instructed in polar care mgt, falls prevention strategies, home/routines modifications, & DME/AE for LB bathing and dressing tasks. Pt would benefit from skilled OT services including additional instruction in dressing techniques with or without assistive devices for dressing and bathing skills to support recall and carryover prior to discharge and ultimately to maximize safety, independence, and minimize falls risk and caregiver burden. Recommend STR to support pt safety and functional independence upon hospital DC.       Follow Up Recommendations  SNF    Equipment Recommendations  3 in 1 bedside commode;Other (comment)(Drop-arm commode to support functional transfers.)    Recommendations for Other Services       Precautions / Restrictions Precautions Precautions: Fall Restrictions Weight Bearing Restrictions: Yes LUE Weight  Bearing: Weight bearing as tolerated LLE Weight Bearing: Non weight bearing Other Position/Activity Restrictions: Per chart, pt has no restrictions on RUE. Is followed by ortho for prior rotator cuff injury.      Mobility Bed Mobility Overal bed mobility: Modified Independent             General bed mobility comments: Semi-supine to/from sit without any noted difficulties; pt steady long sitting, and sitting in adapted fasion at EOB with LLE supported up on low bed this date.  Transfers Overall transfer level: Needs assistance   Transfers: Sit to/from Stand Sit to Stand: Min guard;Min assist         General transfer comment: basic transfers to/from seating surfaces bilat, emphasis on hand placement, sequence and overall safety with movemetn.  Tends to attempt "long way around", requiring consistent cuing for correction and optimal safety.    Balance Overall balance assessment: Needs assistance Sitting-balance support: No upper extremity supported;Feet supported Sitting balance-Leahy Scale: Normal Sitting balance - Comments: steady sitting reaching outside BOS   Standing balance support: Bilateral upper extremity supported Standing balance-Leahy Scale: Poor Standing balance comment: pt requiring at least single UE support for static standing balance                           ADL either performed or assessed with clinical judgement   ADL Overall ADL's : Needs assistance/impaired                                       General ADL Comments:  Pt req. min assist to supervision for LB dressing and bathing tasks due to increased pain and NWB status through LLE. Pt demonstrates good reach to RLE at bed level and is able to doff/don hospital sock without assist/device. OT instructed pt in use of LH reacher to support LB dressing this date. Pt return verbalized understanding of education.     Vision Baseline Vision/History: Wears glasses Wears Glasses:  Reading only Patient Visual Report: No change from baseline       Perception     Praxis      Pertinent Vitals/Pain Pain Assessment: 0-10 Pain Score: 7  Pain Location: L heel/foot Pain Descriptors / Indicators: Guarding;Aching Pain Intervention(s): Limited activity within patient's tolerance;Monitored during session;Repositioned;Ice applied     Hand Dominance Right   Extremity/Trunk Assessment Upper Extremity Assessment Upper Extremity Assessment: (Pt grossly 4/5 t/o LUE. Pt denis pain or limitations with this extremity. RUE MMT limited 2/2 pain/prior injury. Pt ROM grossly WFL, strength as observed during functional activity grossly 3+/5. Grip WFL, grossly normal BUE.)   Lower Extremity Assessment Lower Extremity Assessment: (L ankle immobilized in posterior splint with ace wrap, wiggles toes and reports normal sensation; R LE, L hip and knee grossly WFL)   Cervical / Trunk Assessment Cervical / Trunk Assessment: Normal   Communication Communication Communication: No difficulties   Cognition Arousal/Alertness: Awake/alert Behavior During Therapy: WFL for tasks assessed/performed;Impulsive Overall Cognitive Status: Within Functional Limits for tasks assessed                                 General Comments: Follows one-step VCs consistently. Fairly impulsive with movement and eager to demonstrate he can be independent despite limitations on LLE.   General Comments       Exercises Other Exercises Other Exercises: Pt educated in falls prevention strategies, safe use of AE/DME for ADL mgt, safe transfer strategies, and routines modifications to support safety and functional independence upon hospital DC.   Shoulder Instructions      Home Living Family/patient expects to be discharged to:: Private residence Living Arrangements: Alone Available Help at Discharge: Family Type of Home: House Home Access: Stairs to enter Entergy CorporationEntrance Stairs-Number of Steps: 1 step  plus 3 steps to enter from front; 5 steps with B railings (unable to reach both) from back   Home Layout: One level     Bathroom Shower/Tub: Tub/shower unit;Curtain   FirefighterBathroom Toilet: Standard Bathroom Accessibility: No(Pt states bathroom door is small, would need to leave walker/WC outside)   Home Equipment: Grab bars - tub/shower          Prior Functioning/Environment Level of Independence: Independent        Comments: Active and independent. Injury occurred from fall on the job. Endorses at least 1 additional fall in the past 6 months.        OT Problem List: Decreased strength;Decreased coordination;Pain;Decreased safety awareness;Decreased activity tolerance;Decreased knowledge of use of DME or AE;Decreased knowledge of precautions;Impaired balance (sitting and/or standing)      OT Treatment/Interventions: Self-care/ADL training;Balance training;Therapeutic exercise;Therapeutic activities;DME and/or AE instruction;Patient/family education    OT Goals(Current goals can be found in the care plan section) Acute Rehab OT Goals Patient Stated Goal: to get a wheelchair OT Goal Formulation: With patient Time For Goal Achievement: 12/30/18 Potential to Achieve Goals: Good ADL Goals Pt Will Perform Lower Body Bathing: sitting/lateral leans;with set-up;with supervision(With LRAD PRN for improved safety and functional independence) Pt Will  Perform Lower Body Dressing: with adaptive equipment;with set-up;with supervision;sitting/lateral leans(With LRAD PRN for improved safety and functional independence) Pt Will Transfer to Toilet: with transfer board;with modified independence(With LRAD PRN for improved safety and functional independence)  OT Frequency: Min 1X/week   Barriers to D/C: Inaccessible home environment;Decreased caregiver support          Co-evaluation              AM-PAC OT "6 Clicks" Daily Activity     Outcome Measure Help from another person eating  meals?: A Little Help from another person taking care of personal grooming?: A Little Help from another person toileting, which includes using toliet, bedpan, or urinal?: A Lot Help from another person bathing (including washing, rinsing, drying)?: A Lot Help from another person to put on and taking off regular upper body clothing?: A Little Help from another person to put on and taking off regular lower body clothing?: A Little 6 Click Score: 16   End of Session    Activity Tolerance: Patient tolerated treatment well Patient left: in bed;with call bell/phone within reach;with bed alarm set  OT Visit Diagnosis: Other abnormalities of gait and mobility (R26.89);Pain Pain - Right/Left: Left Pain - part of body: Leg;Ankle and joints of foot                Time: 6160-7371 OT Time Calculation (min): 25 min Charges:  OT General Charges $OT Visit: 1 Visit OT Evaluation $OT Eval Low Complexity: 1 Low OT Treatments $Self Care/Home Management : 8-22 mins  Shara Blazing, M.S., OTR/L Ascom: [phone] 12/16/18, 1:00 PM

## 2018-12-16 NOTE — Progress Notes (Signed)
   12/16/18 1200  Clinical Encounter Type  Visited With Patient not available;Health care provider  Visit Type Initial  Referral From Nurse  Consult/Referral To Chaplain   Chaplain received a referral from the patient's nurse. Upon arrival, the patient was sleeping soundly in the recliner. He did not respond to verbal stimuli. Will attempt to visit later.

## 2018-12-16 NOTE — TOC Transition Note (Signed)
Transition of Care [facility]) - CM/SW Discharge Note   Patient Details  Name: Randall [name] MRN: [id number] Date of Birth: 01/27/1951  Transition of Care [facility] CM/SW Contact:  Su Hilt, RN Phone Number: 12/16/2018, 1:59 PM   Clinical Narrative:     Patient to DC to [facility] today via EMS transport He is awaiting a BM Last Covid results on 10/4 Bedside nurse to call report to [facility] and call EMS for transport when ready Patient will notify his family of the DC Final next level of care: Skilled Nursing Facility Barriers to Discharge: Barriers Resolved   Patient Goals and CMS Choice Patient states their goals for this hospitalization and ongoing recovery are:: go home      Discharge Placement                       Discharge Plan and Services   Discharge Planning Services: CM Consult            DME Arranged: Wheelchair manual DME Agency: [employer] Date DME Agency Contacted: 12/12/18 Time DME Agency Contacted: [phone] Representative spoke with at DME Agency: [facility] HH Arranged: NA          Social Determinants of Health ([place]) Interventions     Readmission Risk Interventions No flowsheet data found.

## 2018-12-16 NOTE — Evaluation (Signed)
Physical Therapy Evaluation Patient Details Name: Randall [name] MRN: [id number] DOB: September 24, 1950 Today's Date: 12/16/2018   History of Present Illness  Pt is a 68 y.o. male presenting to hospital s/p fall 9/29 from scaffold onto L side.  Imaging showing comminuted displaced fx's of body of L calcaneus involving subtalar joint and possible tiny avulsion fx's posterior process of talus; also possible subtle angulated fx L radial neck and large elbow joint effusion.  H/o R shoulder rotator cuff tears.  Now status post L calcaneal ORIF (10/2) with orders for continued therapy and persistent NWB L LE status.  Clinical Impression  Upon evaluation, patient alert and oriented; follows commands and demonstrates good effort with mobility assessment this date.  Rates pain in L ankle/foot at 8/10, worsened with dependent positioning.  L ankle now immobilized in posterior splint with ace wrap; able to wiggle toes and reports return of full sensation to L foot/ankle.  Able to complete bed mobility with mod indep; sit/stand and bed/chair transfers (bilat) with min assist.  Does require UE support for balance at all times.  Constant cuing for hand placement, sequence of transfers (tends to attempt "long way around" despite cuing) with new movement strategy.  Generally impulsive, but does maintain NWB throughout all transfer attempts.  Refuses additional gait/mobility/therex due to pain in L LE with transfers/mobility.  Will continue to assess/progress.  Do anticipate need for WC level as primary mobility immediately upon discharge until bilat UE pain improved (does appear to be less restrictive today) and/or L LE WBing status updated. Would benefit from skilled PT to address above deficits and promote optimal return to PLOF; recommend transition to STR upon discharge from acute hospitalization.   Patient suffers from L calcaneal fracture s/p ORIF that impairs his/her ability to perform daily activities like toileting,  feeding, dressing, grooming, bathing in the home. A cane, walker, crutch will not resolve the patient's issue with performing activities of daily living. A lightweight wheelchair is required/recommended and will allow patient to safely perform daily activities.   Patient can safely propel the wheelchair in the home or has a caregiver who can provide assistance.      Follow Up Recommendations SNF    Equipment Recommendations  3in1 (PT);Wheelchair (measurements PT);Wheelchair cushion (measurements PT)    Recommendations for Other Services       Precautions / Restrictions Precautions Precautions: Fall Restrictions Weight Bearing Restrictions: Yes LUE Weight Bearing: Weight bearing as tolerated LLE Weight Bearing: Non weight bearing Other Position/Activity Restrictions: Per secure text with Dr. Leim Fabry (ortho) 12/11/18: no restriction on R UE from PT standpoint      Mobility  Bed Mobility Overal bed mobility: Modified Independent                Transfers Overall transfer level: Needs assistance   Transfers: Sit to/from Stand;Stand Pivot Transfers Sit to Stand: Min guard;Min assist         General transfer comment: basic transfers to/from seating surfaces bilat, emphasis on hand placement, sequence and overall safety with movemetn.  Tends to attempt "long way around", requiring consistent cuing for correction and optimal safety.  Ambulation/Gait             General Gait Details: patient refused due to pain in L LE with dependent positioning  Stairs            Wheelchair Mobility    Modified Rankin (Stroke Patients Only)       Balance Overall balance assessment: Needs assistance  Sitting-balance support: No upper extremity supported;Feet supported Sitting balance-Leahy Scale: Normal     Standing balance support: Bilateral upper extremity supported Standing balance-Leahy Scale: Poor Standing balance comment: pt requiring at least single UE  support for static standing balance                             Pertinent Vitals/Pain Pain Assessment: 0-10 Pain Score: 8  Pain Location: L heel/foot Pain Descriptors / Indicators: Guarding;Aching Pain Intervention(s): Limited activity within patient's tolerance;Monitored during session;Repositioned    Home Living Family/patient expects to be discharged to:: Private residence Living Arrangements: Alone Available Help at Discharge: Family Type of Home: House Home Access: Stairs to enter   [employer] Corporation[employer]trance Stairs-Number of Steps: 1 step plus 3 steps to enter from front; 5 steps with B railings (unable to reach both) from back Home Layout: One level        Prior Function Level of Independence: Independent               Hand Dominance        Extremity/Trunk Assessment   Upper Extremity Assessment Upper Extremity Assessment: Overall WFL for tasks assessed(grossly at least 3+ to 4-/5 throughout as noted during functional movement; not tested with formal resistance due to pain)    Lower Extremity Assessment Lower Extremity Assessment: (L ankle immobilized in posterior splint with ace wrap, wiggles toes and reports normal sensation; R LE, L hip and knee grossly WFL)       Communication   Communication: No difficulties  Cognition Arousal/Alertness: Awake/alert Behavior During Therapy: WFL for tasks assessed/performed;Impulsive Overall Cognitive Status: Within Functional Limits for tasks assessed                                 General Comments: mild difficulty problem solving new movement strategies      General Comments      Exercises     Assessment/Plan    PT Assessment Patient needs continued PT services  PT Problem List Decreased strength;Decreased range of motion;Decreased activity tolerance;Decreased balance;Decreased mobility;Decreased knowledge of use of DME;Decreased knowledge of precautions;Pain       PT Treatment Interventions  DME instruction;Gait training;Stair training;Functional mobility training;Therapeutic activities;Therapeutic exercise;Balance training;Patient/family education    PT Goals (Current goals can be found in the Care Plan section)  Acute Rehab PT Goals Patient Stated Goal: to get a wheelchair PT Goal Formulation: With patient Time For Goal Achievement: 12/30/18 Potential to Achieve Goals: Good    Frequency 7X/week   Barriers to discharge Decreased caregiver support      Co-evaluation               AM-PAC PT "6 Clicks" Mobility  Outcome Measure Help needed turning from your back to your side while in a flat bed without using bedrails?: None Help needed moving from lying on your back to sitting on the side of a flat bed without using bedrails?: None Help needed moving to and from a bed to a chair (including a wheelchair)?: A Little Help needed standing up from a chair using your arms (e.g., wheelchair or bedside chair)?: A Little Help needed to walk in hospital room?: A Lot Help needed climbing 3-5 steps with a railing? : Total 6 Click Score: 17    End of Session   Activity Tolerance: Patient tolerated treatment well;Patient limited by pain Patient left: in chair;with call  bell/phone within reach;with chair alarm set Nurse Communication: Mobility status PT Visit Diagnosis: Other abnormalities of gait and mobility (R26.89);Muscle weakness (generalized) (M62.81);History of falling (Z91.81);Difficulty in walking, not elsewhere classified (R26.2);Pain Pain - Right/Left: Left Pain - part of body: Ankle and joints of foot    Time: 9326-7124 PT Time Calculation (min) (ACUTE ONLY): 13 min   Charges:   PT Evaluation $PT Re-evaluation: 1 Re-eval          Aleesa Sweigert H. Manson Passey, PT, DPT, NCS 12/16/18, 10:54 AM [phone]

## 2018-12-16 NOTE — Care Management Important Message (Signed)
Important Message  Patient Details  Name: Randall [name] MRN: [id number] Date of Birth: 09-May-1950   Medicare Important Message Given:  Yes     Juliann Pulse A Emily Forse 12/16/2018, 10:47 AM

## 2018-12-16 NOTE — Progress Notes (Signed)
Pt has active bowel sounds. Pt refuses to push laxatives, RN educated pt. VSS. Report called to RN at [facility]. EMS called to transport the pt. Pt assessment unchanged.

## 2019-03-11 ENCOUNTER — Other Ambulatory Visit: Payer: Self-pay

## 2019-03-11 ENCOUNTER — Encounter: Payer: Self-pay | Admitting: Intensive Care

## 2019-03-11 ENCOUNTER — Emergency Department: Payer: Medicare Other

## 2019-03-11 ENCOUNTER — Emergency Department
Admission: EM | Admit: 2019-03-11 | Discharge: 2019-03-11 | Disposition: A | Discharge status: Other Acute Inpatient Hospital | Payer: Medicare Other | Attending: Emergency Medicine | Admitting: Emergency Medicine

## 2019-03-11 DIAGNOSIS — Y999 Unspecified external cause status: Secondary | ICD-10-CM | POA: Diagnosis not present

## 2019-03-11 DIAGNOSIS — Y9289 Other specified places as the place of occurrence of the external cause: Secondary | ICD-10-CM | POA: Insufficient documentation

## 2019-03-11 DIAGNOSIS — Z20828 Contact with and (suspected) exposure to other viral communicable diseases: Secondary | ICD-10-CM | POA: Insufficient documentation

## 2019-03-11 DIAGNOSIS — G9389 Other specified disorders of brain: Secondary | ICD-10-CM | POA: Insufficient documentation

## 2019-03-11 DIAGNOSIS — S0181XA Laceration without foreign body of other part of head, initial encounter: Secondary | ICD-10-CM | POA: Insufficient documentation

## 2019-03-11 DIAGNOSIS — Z79899 Other long term (current) drug therapy: Secondary | ICD-10-CM | POA: Diagnosis not present

## 2019-03-11 DIAGNOSIS — S0990XA Unspecified injury of head, initial encounter: Secondary | ICD-10-CM | POA: Diagnosis present

## 2019-03-11 DIAGNOSIS — S0280XB Fracture of other specified skull and facial bones, unspecified side, initial encounter for open fracture: Secondary | ICD-10-CM | POA: Insufficient documentation

## 2019-03-11 DIAGNOSIS — S0292XB Unspecified fracture of facial bones, initial encounter for open fracture: Secondary | ICD-10-CM

## 2019-03-11 DIAGNOSIS — Y9389 Activity, other specified: Secondary | ICD-10-CM | POA: Diagnosis not present

## 2019-03-11 LAB — URINALYSIS, COMPLETE (UACMP) WITH MICROSCOPIC
Bacteria, UA: NONE SEEN
Bilirubin Urine: NEGATIVE
Glucose, UA: NEGATIVE mg/dL
Hgb urine dipstick: NEGATIVE
Ketones, ur: 20 mg/dL — AB
Leukocytes,Ua: NEGATIVE
Nitrite: NEGATIVE
Protein, ur: NEGATIVE mg/dL
Specific Gravity, Urine: 1.015 (ref 1.005–1.030)
Squamous Epithelial / HPF: NONE SEEN (ref 0–5)
pH: 6 (ref 5.0–8.0)

## 2019-03-11 LAB — URINE DRUG SCREEN, QUALITATIVE (ARMC ONLY)
Amphetamines, Ur Screen: NOT DETECTED
Barbiturates, Ur Screen: NOT DETECTED
Benzodiazepine, Ur Scrn: NOT DETECTED
Cannabinoid 50 Ng, Ur ~~LOC~~: NOT DETECTED
Cocaine Metabolite,Ur ~~LOC~~: NOT DETECTED
MDMA (Ecstasy)Ur Screen: NOT DETECTED
Methadone Scn, Ur: NOT DETECTED
Opiate, Ur Screen: NOT DETECTED
Phencyclidine (PCP) Ur S: NOT DETECTED
Tricyclic, Ur Screen: NOT DETECTED

## 2019-03-11 LAB — COMPREHENSIVE METABOLIC PANEL
ALT: 19 U/L (ref 0–44)
AST: 21 U/L (ref 15–41)
Albumin: 4.4 g/dL (ref 3.5–5.0)
Alkaline Phosphatase: 58 U/L (ref 38–126)
Anion gap: 10 (ref 5–15)
BUN: 10 mg/dL (ref 8–23)
CO2: 22 mmol/L (ref 22–32)
Calcium: 9.3 mg/dL (ref 8.9–10.3)
Chloride: 106 mmol/L (ref 98–111)
Creatinine, Ser: 0.77 mg/dL (ref 0.61–1.24)
GFR calc Af Amer: >60 mL/min (ref >60)
GFR calc non Af Amer: >60 mL/min (ref >60)
Glucose, Bld: 133 mg/dL — ABNORMAL HIGH (ref 70–99)
Potassium: 3.4 mmol/L — ABNORMAL LOW (ref 3.5–5.1)
Sodium: 138 mmol/L (ref 135–145)
Total Bilirubin: 0.9 mg/dL (ref 0.3–1.2)
Total Protein: 7.4 g/dL (ref 6.5–8.1)

## 2019-03-11 LAB — ETHANOL: Alcohol, Ethyl (B): <10 mg/dL (ref <10)

## 2019-03-11 LAB — CBC WITH DIFFERENTIAL/PLATELET
Abs Immature Granulocytes: 0.07 10*3/uL (ref 0.00–0.07)
Basophils Absolute: 0 10*3/uL (ref 0.0–0.1)
Basophils Relative: 0 %
Eosinophils Absolute: 0 10*3/uL (ref 0.0–0.5)
Eosinophils Relative: 0 %
HCT: 38.8 % — ABNORMAL LOW (ref 39.0–52.0)
Hemoglobin: 13.3 g/dL (ref 13.0–17.0)
Immature Granulocytes: 0 %
Lymphocytes Relative: 7 %
Lymphs Abs: 1.2 10*3/uL (ref 0.7–4.0)
MCH: 31.4 pg (ref 26.0–34.0)
MCHC: 34.3 g/dL (ref 30.0–36.0)
MCV: 91.7 fL (ref 80.0–100.0)
Monocytes Absolute: 0.8 10*3/uL (ref 0.1–1.0)
Monocytes Relative: 5 %
Neutro Abs: 14.2 10*3/uL — ABNORMAL HIGH (ref 1.7–7.7)
Neutrophils Relative %: 88 %
Platelets: 216 10*3/uL (ref 150–400)
RBC: 4.23 MIL/uL (ref 4.22–5.81)
RDW: 12.6 % (ref 11.5–15.5)
WBC: 16.3 10*3/uL — ABNORMAL HIGH (ref 4.0–10.5)
nRBC: 0 % (ref 0.0–0.2)

## 2019-03-11 LAB — PROTIME-INR
INR: 1 (ref 0.8–1.2)
Prothrombin Time: 13.1 seconds (ref 11.4–15.2)

## 2019-03-11 MED ORDER — LIDOCAINE-EPINEPHRINE (PF) 2 %-1:200000 IJ SOLN
20.0000 mL | Freq: Once | INTRAMUSCULAR | Status: AC
  Administered 2019-03-11: 20 mL
  Filled 2019-03-11: qty 20

## 2019-03-11 MED ORDER — ONDANSETRON HCL 4 MG/2ML IJ SOLN
4.0000 mg | Freq: Once | INTRAMUSCULAR | Status: AC
  Administered 2019-03-11: 4 mg via INTRAVENOUS
  Filled 2019-03-11: qty 2

## 2019-03-11 MED ORDER — MORPHINE SULFATE (PF) 4 MG/ML IV SOLN
4.0000 mg | Freq: Once | INTRAVENOUS | Status: AC
  Administered 2019-03-11: 4 mg via INTRAVENOUS
  Filled 2019-03-11: qty 1

## 2019-03-11 MED ORDER — PIPERACILLIN-TAZOBACTAM 3.375 G IVPB 30 MIN
3.3750 g | Freq: Once | INTRAVENOUS | Status: AC
  Administered 2019-03-11: 21:00:00 3.375 g via INTRAVENOUS
  Filled 2019-03-11: qty 50

## 2019-03-11 NOTE — ED Notes (Signed)
Neck brace placed on pt with no issue.
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
## 2019-03-11 NOTE — ED Notes (Signed)
[employer] to look into finding a contact for pt. Pt was able to remember a name, Randall [name], that lives in [place].

## 2019-03-11 NOTE — ED Notes (Signed)
Patient transported to CT 
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
## 2019-03-11 NOTE — ED Notes (Signed)
facesheet sent to [facility]

## 2019-03-12 LAB — SARS CORONAVIRUS 2 (TAT 6-24 HRS): SARS Coronavirus 2: NEGATIVE

## 2019-03-12 MED ORDER — HYDROMORPHONE HCL 1 MG/ML IJ SOLN
0.50 | INTRAMUSCULAR | Status: DC
Start: ? — End: 2019-03-12

## 2019-03-12 MED ORDER — GENERIC EXTERNAL MEDICATION
5.00 | Status: DC
Start: 2019-03-12 — End: 2019-03-12

## 2021-03-30 ENCOUNTER — Other Ambulatory Visit: Payer: Self-pay

## 2021-03-30 ENCOUNTER — Emergency Department: Payer: Medicare Other

## 2021-03-30 ENCOUNTER — Emergency Department
Admission: EM | Admit: 2021-03-30 | Discharge: 2021-03-30 | Disposition: A | Discharge status: Home / Self Care | Payer: Medicare Other | Attending: Emergency Medicine | Admitting: Emergency Medicine

## 2021-03-30 ENCOUNTER — Encounter: Payer: Self-pay | Admitting: Emergency Medicine

## 2021-03-30 DIAGNOSIS — N433 Hydrocele, unspecified: Secondary | ICD-10-CM | POA: Diagnosis not present

## 2021-03-30 DIAGNOSIS — N5082 Scrotal pain: Secondary | ICD-10-CM | POA: Diagnosis present

## 2021-03-30 DIAGNOSIS — Z202 Contact with and (suspected) exposure to infections with a predominantly sexual mode of transmission: Secondary | ICD-10-CM | POA: Diagnosis not present

## 2021-03-30 DIAGNOSIS — J029 Acute pharyngitis, unspecified: Secondary | ICD-10-CM | POA: Diagnosis not present

## 2021-03-30 LAB — CHLAMYDIA/NGC RT PCR (ARMC ONLY)
Chlamydia Tr: NOT DETECTED
Chlamydia Tr: NOT DETECTED
N gonorrhoeae: NOT DETECTED
N gonorrhoeae: NOT DETECTED

## 2021-03-30 LAB — URINALYSIS, COMPLETE (UACMP) WITH MICROSCOPIC
Bacteria, UA: NONE SEEN
Bilirubin Urine: NEGATIVE
Glucose, UA: NEGATIVE mg/dL
Hgb urine dipstick: NEGATIVE
Ketones, ur: 5 mg/dL — AB
Leukocytes,Ua: NEGATIVE
Nitrite: NEGATIVE
Protein, ur: NEGATIVE mg/dL
Specific Gravity, Urine: 1.023 (ref 1.005–1.030)
pH: 5 (ref 5.0–8.0)

## 2021-03-30 LAB — GROUP A STREP BY PCR: Group A Strep by PCR: NOT DETECTED

## 2021-03-30 LAB — HIV ANTIBODY (ROUTINE TESTING W REFLEX): HIV Screen 4th Generation wRfx: NONREACTIVE

## 2021-03-30 MED ORDER — NAPROXEN 500 MG PO TABS
500.0000 mg | ORAL_TABLET | Freq: Once | ORAL | Status: AC
  Administered 2021-03-30: 500 mg via ORAL
  Filled 2021-03-30: qty 1

## 2021-03-30 MED ORDER — DOXYCYCLINE HYCLATE 100 MG PO CAPS: 100.0000 mg | ORAL_CAPSULE | Freq: Two times a day (BID) | ORAL | 0 refills | Status: AC

## 2021-03-30 MED ORDER — CEFTRIAXONE SODIUM 1 G IJ SOLR
500.0000 mg | Freq: Once | INTRAMUSCULAR | Status: AC
  Administered 2021-03-30: 500 mg via INTRAMUSCULAR
  Filled 2021-03-30: qty 10

## 2021-03-30 NOTE — ED Notes (Signed)
ED provider at bedside. Urinal provided for urine and gc/chlam samples.

## 2021-03-30 NOTE — ED Notes (Signed)
This Clinical research associate assisted EDP w/ exams.  Pt noted to have "knot" on pelvic area L of penis and red, itchy bumps noted near rectum.

## 2021-03-30 NOTE — ED Notes (Signed)
See triage note. Pt denies pain, burning with urination. Complains of frequent sore throats. Pt in NAD.

## 2021-03-30 NOTE — ED Triage Notes (Signed)
Pt comes into the ED via POV c/o need for STD checking.  Pt states he is having pain in the groin but denies any burning with urination or discharge.  Pt states he thinks he may have been with someone who may be positive for something.
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
## 2021-03-31 LAB — RPR
RPR Ser Ql: REACTIVE — AB
RPR Titer: 1:128 {titer}

## 2021-04-01 LAB — T.PALLIDUM AB, TOTAL: T Pallidum Abs: REACTIVE — AB

## 2021-04-04 ENCOUNTER — Telehealth: Payer: Self-pay | Admitting: Emergency Medicine

## 2021-04-05 ENCOUNTER — Encounter: Payer: Self-pay | Admitting: Family Medicine

## 2021-04-05 ENCOUNTER — Other Ambulatory Visit: Payer: Self-pay

## 2021-04-05 ENCOUNTER — Ambulatory Visit: Payer: Medicare Other | Admitting: Family Medicine

## 2021-04-05 DIAGNOSIS — A539 Syphilis, unspecified: Secondary | ICD-10-CM

## 2021-04-05 MED ORDER — PENICILLIN G BENZATHINE 2400000 UNIT/4ML IM SUSP: 2.4000 10*6.[IU] | INTRAMUSCULAR | Status: DC

## 2021-04-05 MED ORDER — PENICILLIN G BENZATHINE 2400000 UNIT/4ML IM SUSP
2.4000 10*6.[IU] | Freq: Once | INTRAMUSCULAR | Status: AC
  Administered 2021-04-05: 10:00:00 2400000 [IU] via INTRAMUSCULAR

## 2021-04-05 NOTE — Progress Notes (Signed)
Pt here for treatment of Syphilis.  Bicillin 2.4 MU given IM without any complications.  Pt given PCP list and condoms.  Windle Guard, RN
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
## 2021-09-02 ENCOUNTER — Ambulatory Visit: Payer: Self-pay | Admitting: Nurse Practitioner

## 2021-09-02 ENCOUNTER — Encounter: Payer: Self-pay | Admitting: Nurse Practitioner

## 2021-09-02 DIAGNOSIS — Z113 Encounter for screening for infections with a predominantly sexual mode of transmission: Secondary | ICD-10-CM
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
## 2021-09-12 ENCOUNTER — Telehealth: Payer: Self-pay

## 2021-09-12 NOTE — Telephone Encounter (Signed)
Pt had attempted to return call this am to [facility]. Did not leave message. This nurse attempted to call him back,LMRTC regarding syphilis test results.

## 2021-09-14 NOTE — Telephone Encounter (Addendum)
E-mail note received by CD Coordinator:  Playing phone tag .Lorene Dy called ,then he tried her ,then I tried him. Syphilis result   Needs follow-up phone call.   See notes and order (no tx at this time, titer declining)

## 2021-09-15 NOTE — Telephone Encounter (Signed)
Received call back from pt and pt confirmed password. Counseled pt regarding last syphilis titer, no additional tx at this time. Pt states this last test was his 6 month follow-up. Counseled he should follow-up at least every year for bloodwork, sooner if any concerns arise.

## 2021-09-15 NOTE — Telephone Encounter (Signed)
Phone call to [phone]. Left message that RN with [facility] is calling, have some information to share with him. Please call Eydie Wormley at [phone].   (09/02/21 RPR titer 1:8, declining.  No additional tx at this time. Continue to monitor/follow-up.)

## 2021-12-26 ENCOUNTER — Ambulatory Visit: Payer: Medicare Other

## 2022-06-06 ENCOUNTER — Ambulatory Visit: Payer: 59 | Admitting: Family Medicine

## 2022-06-06 ENCOUNTER — Encounter: Payer: Self-pay | Admitting: Family Medicine

## 2022-06-06 DIAGNOSIS — Z8619 Personal history of other infectious and parasitic diseases: Secondary | ICD-10-CM

## 2022-06-06 DIAGNOSIS — Z113 Encounter for screening for infections with a predominantly sexual mode of transmission: Secondary | ICD-10-CM

## 2022-06-06 LAB — HM HIV SCREENING LAB: HM HIV Screening: NEGATIVE

## 2022-06-06 LAB — HM HEPATITIS C SCREENING LAB: HM Hepatitis Screen: NEGATIVE

## 2022-06-06 NOTE — Progress Notes (Signed)
[facility] Department STI clinic/screening visit  Subjective:  Randall [name] is a 72 y.o. male being seen today for an STI screening visit. The patient reports they do not have symptoms.    Patient has the following medical conditions:   Patient Active Problem List   Diagnosis Date Noted   Calcaneal fracture 12/10/2018     Chief Complaint  Patient presents with   SEXUALLY TRANSMITTED DISEASE    STI screening-had 2 sores come up on right inner thigh approx 2-3 months ago. Very painful to touch.  Also knot on lymph node in groin area     HPI  Patient reports to clinic for STI Testing. Patient is currently asymptomatic. He states that about 2-3 months ago he had 2 very painful lesions on his leg, and then 1 on his groin. He states that he has no lesions now. He states that they stayed there for about 2 weeks, and he "put some antibiotics on it" then it went away.   Last HIV test per patient/review of record was No results found for: "HMHIVSCREEN"  Lab Results  Component Value Date   HIV Non Reactive 03/30/2021    Does the patient or their partner desires a pregnancy in the next year? No  Screening for MPX risk: Does the patient have an unexplained rash? No Is the patient MSM? No Does the patient endorse multiple sex partners or anonymous sex partners? No Did the patient have close or sexual contact with a person diagnosed with MPX? No Has the patient traveled outside the [place] where MPX is endemic? No Is there a high clinical suspicion for MPX-- evidenced by one of the following No  -Unlikely to be chickenpox  -Lymphadenopathy  -Rash that present in same phase of evolution on any given body part   See flowsheet for further details and programmatic requirements.   Immunization History  Administered Date(s) Administered   Fluad Quad(high Dose 65+) 12/11/2018   Tdap 05/20/2015     The following portions of the patient's history were reviewed and updated as  appropriate: allergies, current medications, past medical history, past social history, past surgical history and problem list.  Objective:  There were no vitals filed for this visit.  Physical Exam Vitals and nursing note reviewed.  Constitutional:      Appearance: Normal appearance.  HENT:     Head: Normocephalic and atraumatic.     Mouth/Throat:     Mouth: Mucous membranes are moist.     Pharynx: No oropharyngeal exudate or posterior oropharyngeal erythema.  Eyes:     General:        Right eye: No discharge.        Left eye: No discharge.     Conjunctiva/sclera:     Right eye: Right conjunctiva is not injected. No exudate.    Left eye: Left conjunctiva is not injected. No exudate. Pulmonary:     Effort: Pulmonary effort is normal.  Abdominal:     General: Abdomen is flat.     Palpations: Abdomen is soft. There is no hepatomegaly or mass.     Tenderness: There is no abdominal tenderness. There is no rebound.  Genitourinary:    Comments: Declined genital exam- asymptomatic Lymphadenopathy:     Cervical: No cervical adenopathy.     Upper Body:     Right upper body: No supraclavicular or axillary adenopathy.     Left upper body: No supraclavicular or axillary adenopathy.  Skin:    General: Skin  is warm and dry.  Neurological:     Mental Status: He is alert and oriented to person, place, and time.       Assessment and Plan:  Randall [name] is a 72 y.o. male presenting to the [facility] Department for STI screening  1. Screening for venereal disease  - Chlamydia/Gonorrhea [place] [facility] - Syphilis Serology, [place] [facility] - HIV/HCV [facility] - Chlamydia/GC NAA, Confirmation  2. History of syphilis Tx in 03/2021 for syphilis. Will evaluate titer today.    Patient does not have STI symptoms Patient accepted all screenings including  urine GC/Chlamydia, and blood work for HIV/Syphilis. Patient meets criteria for HepB screening? No. Ordered? not  applicable Patient meets criteria for HepC screening? No. Ordered? not applicable Recommended condom use with all sex Discussed importance of condom use for STI prevent  Treat positive test results per standing order. Discussed time line for [facility] results and that patient will be called with positive results and encouraged patient to call if he had not heard in 2 weeks Recommended repeat testing in 3 months with positive results. Recommended returning for continued or worsening symptoms.   Return if symptoms worsen or fail to improve.  Total time spent 20 minutes.   Sharlet Salina, [place]

## 2022-06-08 LAB — CHLAMYDIA/GC NAA, CONFIRMATION
Chlamydia trachomatis, NAA: NEGATIVE
Neisseria gonorrhoeae, NAA: NEGATIVE

## 2022-06-16 ENCOUNTER — Telehealth: Payer: Self-pay | Admitting: Family Medicine

## 2022-06-16 NOTE — Telephone Encounter (Signed)
Pt notified of negative GC/CT, HIV and Hep C results.   Pt informed that his Syphilis test has not resulted as of today.

## 2022-06-16 NOTE — Telephone Encounter (Signed)
Patient called wanting his results from testing.

## 2022-06-22 ENCOUNTER — Telehealth: Payer: Self-pay

## 2022-06-22 NOTE — Telephone Encounter (Signed)
Received return call from pt and pt confirmed password. Counseled pt re most recent syphilis TR and tx not indicated per [facility] provider. Discussed expectations for syphilis TR in future, please do not donate blood/plasma, follow-up with yearly bloodwork.

## 2022-06-22 NOTE — Telephone Encounter (Signed)
Call to pt re sypilis result from 06/06/22 specimen, Titer 1:2.   Per [name], FNP written notes/order on TR: No further tx indicated at this time (see scanned TR).  Hx: 03/30/21 TR 1:128 - pt treated with bicillin x1; 09/02/21 TR 1:8  Phone call to pt at [phone]. Left message on voicemail stating RN with [facility] is calling re TR. Please call Randall [name] at [phone].

## 2023-05-06 IMAGING — US US SCROTUM W/ DOPPLER COMPLETE
1 series · 15 of 25 positions shown · non-contrast
Comparison: None.

CLINICAL DATA: Scrotal pain

EXAM:
SCROTAL ULTRASOUND
DOPPLER ULTRASOUND OF THE TESTICLES
TECHNIQUE: Complete ultrasound examination of the testicles, epididymis, and
other scrotal structures was performed. Color and spectral Doppler
ultrasound were also utilized to evaluate blood flow to the
testicles.

[Series 1: us art/ven flow abd pelv doppl · 15 of 59 slices shown]
[im 1/59]
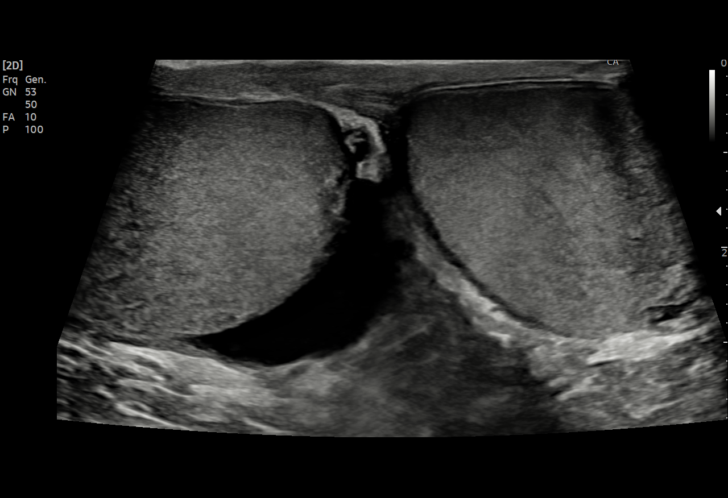
[im 5/59]
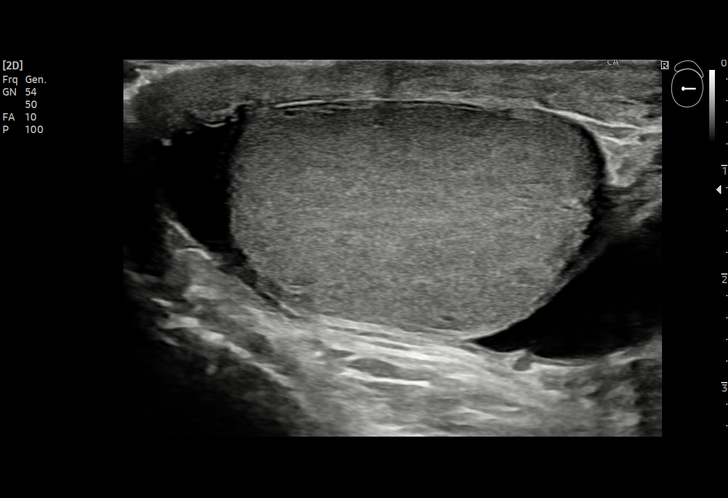
[im 10/59]
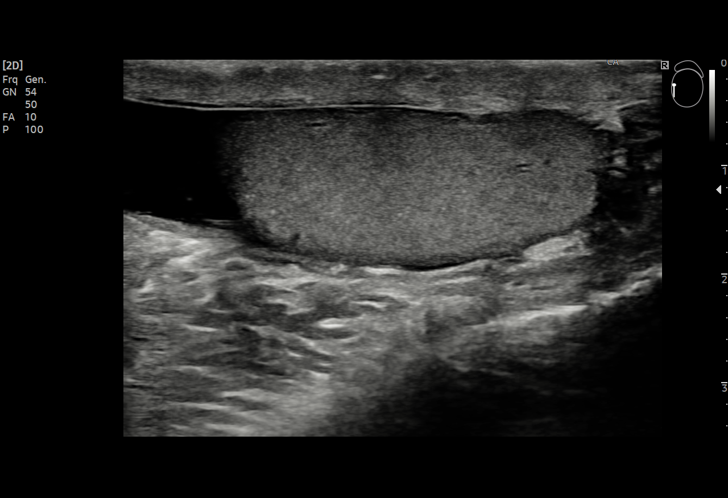
[im 13/59]
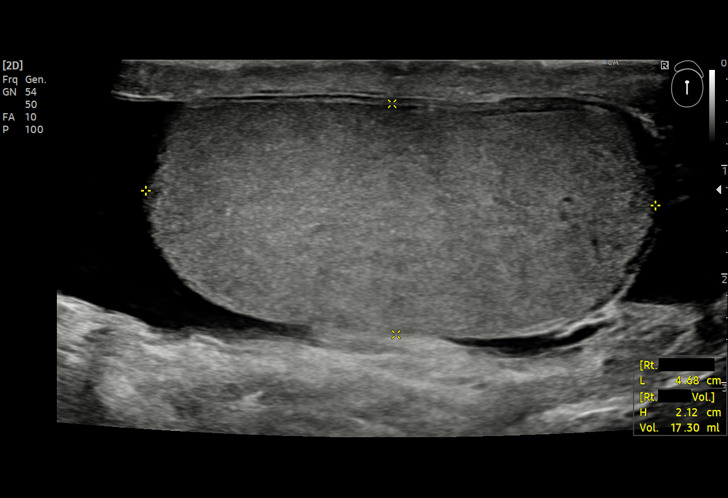
[im 17/59]
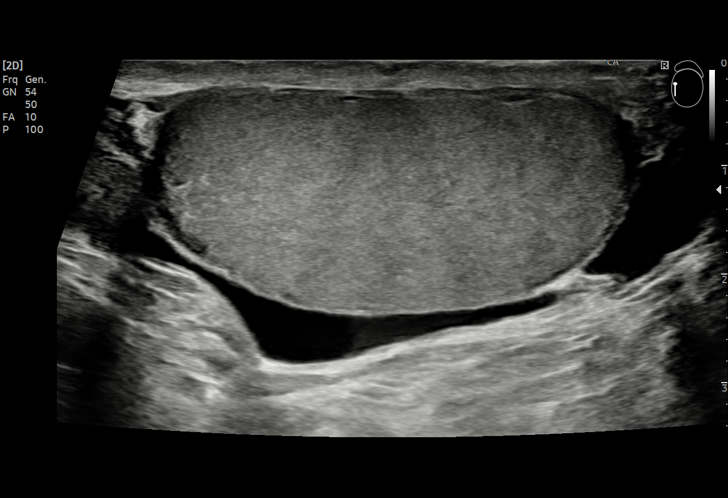
[im 22/59]
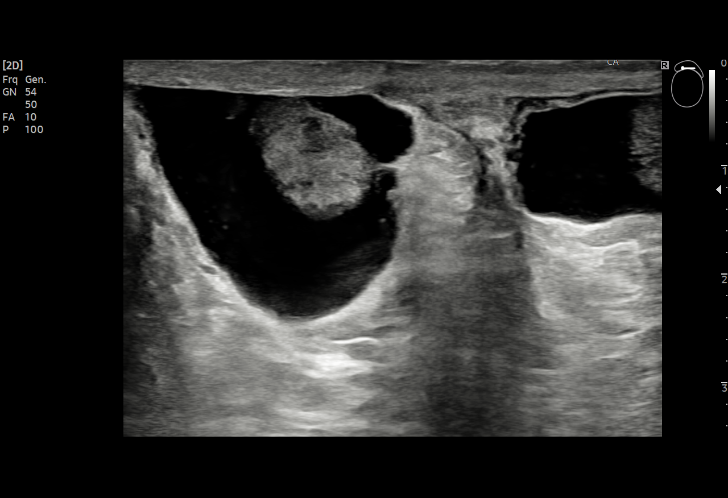
[im 25/59]
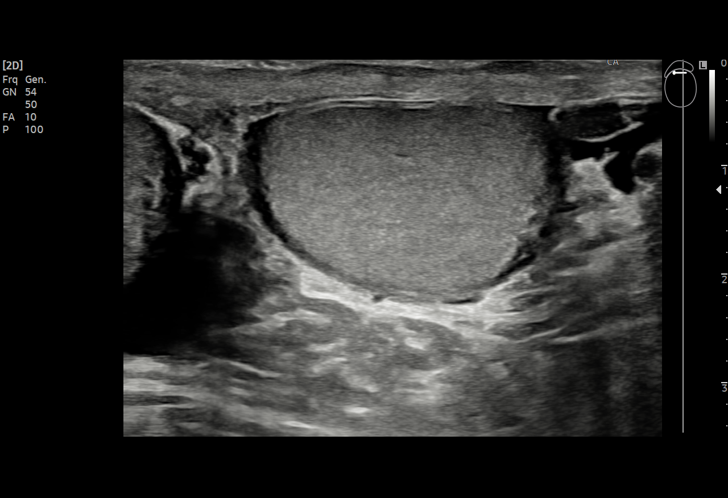
[im 30/59]
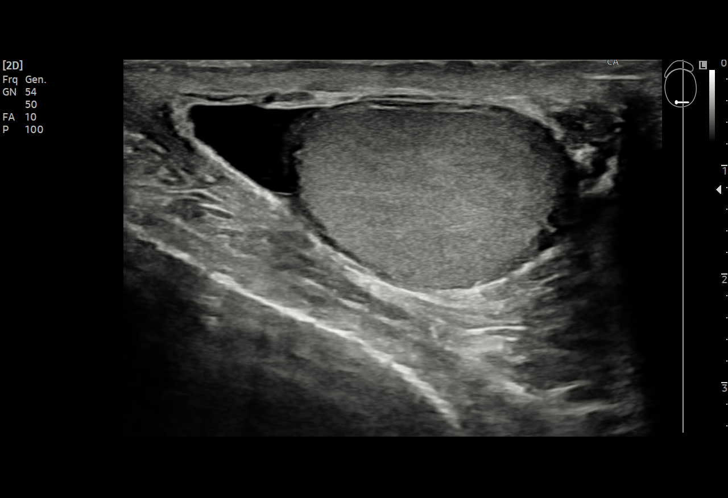
[im 34/59]
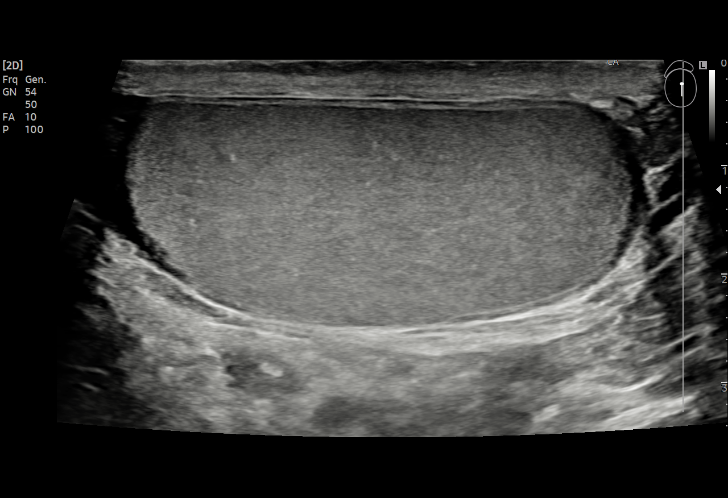
[im 37/59]
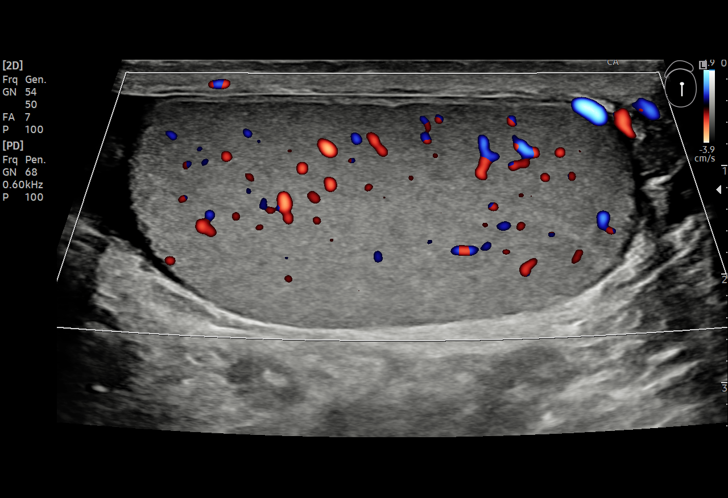
[im 42/59]
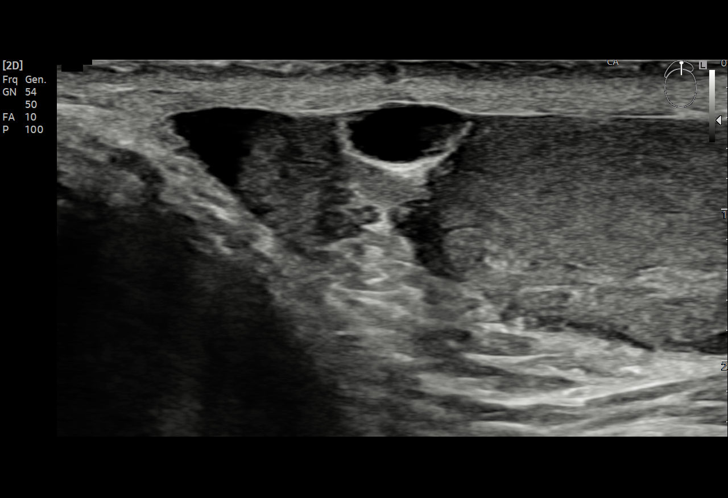
[im 46/59]
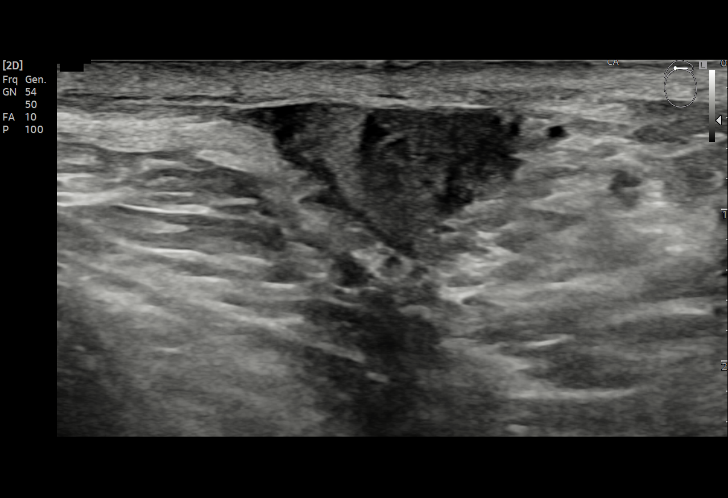
[im 49/59]
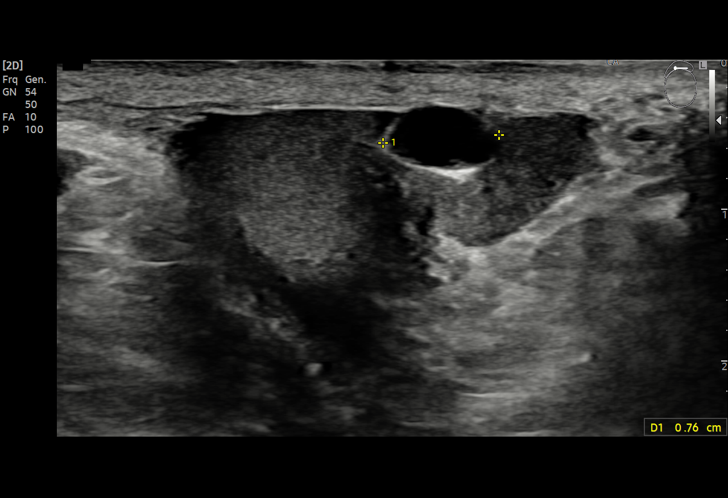
[im 54/59]
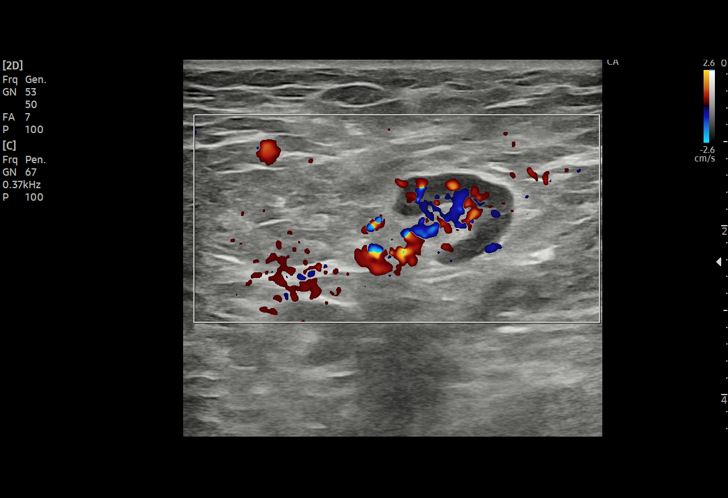
[im 59/59]
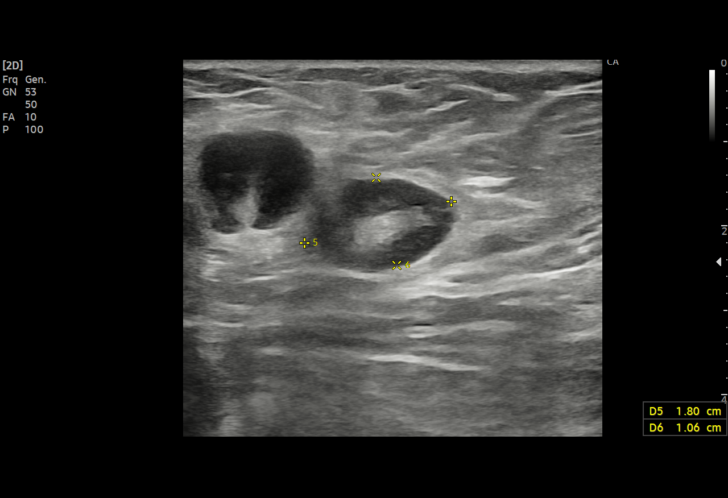

[15 of 25 positions shown; findings below may reference images not displayed]

FINDINGS: Right testicle

Measurements: 4.7 cm x 2.1 cm x 3.3 cm. No mass or microlithiasis
visualized.

Left testicle

Measurements: 4.7 cm x 2.0 cm x 3.3 cm. No mass or microlithiasis
visualized.

Right epididymis:  Normal in size and appearance.

Left epididymis: An incidental subcentimeter epididymal head cyst is
noted.

Hydrocele:  Right larger than left hydroceles are noted.

Varicocele:  None visualized.

Pulsed Doppler interrogation of both testes demonstrates normal low
resistance arterial and venous waveforms bilaterally.

Other: 2 lymph nodes are noted in the left groin measuring up to
cm in short axis. Reniform morphology and fatty hila are maintained.
IMPRESSION: 1. Right larger than left hydroceles.
2. Otherwise, no acute findings.

## 2023-07-12 ENCOUNTER — Encounter: Payer: Self-pay | Admitting: Nurse Practitioner

## 2023-07-12 ENCOUNTER — Ambulatory Visit: Admitting: Nurse Practitioner

## 2023-07-12 DIAGNOSIS — Z113 Encounter for screening for infections with a predominantly sexual mode of transmission: Secondary | ICD-10-CM

## 2023-07-12 DIAGNOSIS — Z8619 Personal history of other infectious and parasitic diseases: Secondary | ICD-10-CM

## 2023-07-12 LAB — HM HIV SCREENING LAB: HM HIV Screening: NEGATIVE

## 2023-07-12 NOTE — Progress Notes (Signed)
 Pt is here for STD screening, condoms declined. Austine Lefort, RN.

## 2023-07-12 NOTE — Progress Notes (Signed)
 [facility] Department STI clinic [address], [address] [place] [address] Main phone: [phone]  STI screening visit  Subjective:  Randall [name] is a 73 y.o. male being seen today for an STI screening visit. The patient reports they do not have symptoms.    Patient has the following medical conditions:  Patient Active Problem List   Diagnosis Date Noted   Calcaneal fracture 12/10/2018    Chief Complaint  Patient presents with   SEXUALLY TRANSMITTED DISEASE    HPI HPI Patient reports an enlarged axillary lymph node. He has a PCP that he saw for it last month and was told it was a boil and he was given antibiotics. The antibiotics resolved one side.  STI screening history: Last HIV test per patient/review of record was  Lab Results  Component Value Date   HMHIVSCREEN Negative - Validated 06/06/2022    Lab Results  Component Value Date   HIV Non Reactive 03/30/2021    Last HEPC test per patient/review of record was  Lab Results  Component Value Date   HMHEPCSCREEN Negative-Validated 06/06/2022   No components found for: "HEPC"   Last HEPB test per patient/review of record was No components found for: "HMHEPBSCREEN"   Fertility: Does the patient or their partner desires a pregnancy in the next year? No  Screening for MPX risk: Does the patient have an unexplained rash? No Is the patient MSM? No Does the patient endorse multiple sex partners or anonymous sex partners? No Did the patient have close or sexual contact with a person diagnosed with MPX? No Has the patient traveled outside the [place]  where MPX is endemic? No Is there a high clinical suspicion for MPX-- evidenced by one of the following No  -Unlikely to be chickenpox  -Lymphadenopathy  -Rash that present in same phase of evolution on any given body part   See flowsheet for further details and programmatic requirements.   Immunization History  Administered Date(s)  Administered   Fluad Quad(high Dose 65+) 12/11/2018   Tdap 05/20/2015     The following portions of the patient's history were reviewed and updated as appropriate: allergies, current medications, past medical history, past social history, past surgical history and problem list.  Objective:  There were no vitals filed for this visit.  Physical Exam Vitals and nursing note reviewed.  Constitutional:      Appearance: Normal appearance.  HENT:     Head: Normocephalic and atraumatic.     Mouth/Throat:     Mouth: Mucous membranes are moist.     Pharynx: No oropharyngeal exudate or posterior oropharyngeal erythema.  Eyes:     General:        Right eye: No discharge.        Left eye: No discharge.     Conjunctiva/sclera:     Right eye: Right conjunctiva is not injected. No exudate.    Left eye: Left conjunctiva is not injected. No exudate. Pulmonary:     Effort: Pulmonary effort is normal.  Abdominal:     General: Abdomen is flat.     Palpations: Abdomen is soft. There is no hepatomegaly or mass.     Tenderness: There is no abdominal tenderness. There is no rebound.  Genitourinary:    Comments: Declined genital exam- asymptomatic Lymphadenopathy:     Cervical: No cervical adenopathy.     Upper Body:     Right upper body: Axillary adenopathy (fixed firm nodule noted on upper outer aspect of  axillary) present. No supraclavicular adenopathy.     Left upper body: No supraclavicular or axillary adenopathy.  Skin:    General: Skin is warm and dry.  Neurological:     Mental Status: He is alert and oriented to person, place, and time.     Assessment and Plan:  Randall [name] is a 73 y.o. male presenting to the [facility] Department for STI screening  1. Screening for venereal disease  - Syphilis Serology, [facility] - [facility] - Chlamydia/GC NAA, Confirmation  2. History of syphilis (Primary) Treated in 2023 with titer 1:128 Last titer 06/2022 was  reactive 1:2   Asked pt to f/u with PCP regarding fixed firm nodule noted on upper outer aspect of right axillary.  Patient does not have STI symptoms Patient accepted all screenings including  urine GC/Chlamydia, and blood work for HIV/Syphilis. Patient meets criteria for HepB screening? No. Ordered? no Patient meets criteria for HepC screening? Yes. Ordered? No. Pt had HCV testing last year and negative, no new partners Recommended condom use with all sex Discussed importance of condom use for STI prevention  Treat positive test results per standing order. Discussed time line for [facility] results and that patient will be called with positive results and encouraged patient to call if he had not heard in 2 weeks Recommended repeat testing in 3 months with positive results. Recommended returning for continued or worsening symptoms.   No follow-ups on file.  Future Appointments  Date Time Provider Department Center  07/12/2023  2:20 PM Brody Kump K, NP [facility] None    Dorna Gasman, NP

## 2023-07-15 LAB — CHLAMYDIA/GC NAA, CONFIRMATION
Chlamydia trachomatis, NAA: NEGATIVE
Neisseria gonorrhoeae, NAA: NEGATIVE

## 2023-07-25 ENCOUNTER — Encounter: Payer: Self-pay | Admitting: Nurse Practitioner

## 2023-08-01 ENCOUNTER — Telehealth: Payer: Self-pay | Admitting: Family Medicine

## 2023-09-25 ENCOUNTER — Emergency Department

## 2023-09-25 ENCOUNTER — Emergency Department
Admission: EM | Admit: 2023-09-25 | Discharge: 2023-09-25 | Disposition: A | Discharge status: Home / Self Care | Attending: Emergency Medicine | Admitting: Emergency Medicine

## 2023-09-25 DIAGNOSIS — R079 Chest pain, unspecified: Secondary | ICD-10-CM | POA: Diagnosis present

## 2023-09-25 DIAGNOSIS — B029 Zoster without complications: Secondary | ICD-10-CM | POA: Insufficient documentation

## 2023-09-25 LAB — CBC
HCT: 40.3 % (ref 39.0–52.0)
Hemoglobin: 13.7 g/dL (ref 13.0–17.0)
MCH: 30.9 pg (ref 26.0–34.0)
MCHC: 34 g/dL (ref 30.0–36.0)
MCV: 91 fL (ref 80.0–100.0)
Platelets: 229 K/uL (ref 150–400)
RBC: 4.43 MIL/uL (ref 4.22–5.81)
RDW: 12.4 % (ref 11.5–15.5)
WBC: 7 K/uL (ref 4.0–10.5)
nRBC: 0 % (ref 0.0–0.2)

## 2023-09-25 LAB — BASIC METABOLIC PANEL WITH GFR
Anion gap: 9 (ref 5–15)
BUN: 11 mg/dL (ref 8–23)
CO2: 24 mmol/L (ref 22–32)
Calcium: 9.1 mg/dL (ref 8.9–10.3)
Chloride: 104 mmol/L (ref 98–111)
Creatinine, Ser: 0.93 mg/dL (ref 0.61–1.24)
GFR, Estimated: >60 mL/min (ref >60)
Glucose, Bld: 107 mg/dL — ABNORMAL HIGH (ref 70–99)
Potassium: 3.7 mmol/L (ref 3.5–5.1)
Sodium: 137 mmol/L (ref 135–145)

## 2023-09-25 LAB — TROPONIN I (HIGH SENSITIVITY): Troponin I (High Sensitivity): 4 ng/L (ref <18)

## 2023-09-25 MED ORDER — VALACYCLOVIR HCL 1 G PO TABS: 1000.0000 mg | ORAL_TABLET | Freq: Three times a day (TID) | ORAL | 0 refills | Status: AC

## 2023-09-25 MED ORDER — TRAMADOL HCL 50 MG PO TABS: 50.0000 mg | ORAL_TABLET | Freq: Four times a day (QID) | ORAL | 0 refills | Status: AC | PRN

## 2023-09-25 NOTE — ED Triage Notes (Signed)
 Pt to ED from home with generalized CP since yesterday. Denies SOB/dizziness. Pt denies cardiac hx. Skin warm dry in triage.

## 2023-09-25 NOTE — ED Provider Notes (Signed)
 [facility] Provider Note    Event Date/Time   First MD Initiated Contact with Patient 09/25/23 [phone]     (approximate)   History   Chest Pain   HPI  Randall [name] is a 73 y.o. male with no significant past medical history who presents with complaints of right-sided chest pain which wraps around from his back.  He reports this been ongoing for several days.  No shortness of breath, no fever     Physical Exam   Triage Vital Signs: ED Triage Vitals  Encounter Vitals Group     BP 09/25/23 0900 (!) 146/81     Girls Systolic BP Percentile --      Girls Diastolic BP Percentile --      Boys Systolic BP Percentile --      Boys Diastolic BP Percentile --      Pulse Rate 09/25/23 0900 76     Resp 09/25/23 0900 18     Temp 09/25/23 0900 98.1 F (36.7 C)     Temp src --      SpO2 09/25/23 0900 96 %     Weight 09/25/23 0844 83.9 kg (185 lb)     Height 09/25/23 0844 1.702 m (5' 7)     Head Circumference --      Peak Flow --      Pain Score 09/25/23 0844 7     Pain Loc --      Pain Education --      Exclude from Growth Chart --     Most recent vital signs: Vitals:   09/25/23 0900  BP: (!) 146/81  Pulse: 76  Resp: 18  Temp: 98.1 F (36.7 C)  SpO2: 96%     General: Awake, no distress.  CV:  Good peripheral perfusion.  Patient has zoster lesion alongside the right lateral chest Resp:  Normal effort.  Clear to auscultation bilaterally Abd:  No distention.  Other:  No calf pain or swelling   ED Results / Procedures / Treatments   Labs (all labs ordered are listed, but only abnormal results are displayed) Labs Reviewed  BASIC METABOLIC PANEL WITH GFR - Abnormal; Notable for the following components:      Result Value   Glucose, Bld 107 (*)    All other components within normal limits  CBC  TROPONIN I (HIGH SENSITIVITY)     EKG  ED ECG REPORT I, Lamar Price, the attending physician, personally viewed and interpreted this  ECG.  Date: 09/25/2023  Rhythm: normal sinus rhythm QRS Axis: normal Intervals: normal ST/T Wave abnormalities: normal Narrative Interpretation: no evidence of acute ischemia    RADIOLOGY Chest x-ray viewed interpreted by me, no acute abnormality    PROCEDURES:  Critical Care performed:   Procedures   MEDICATIONS ORDERED IN ED: Medications - No data to display   IMPRESSION / MDM / ASSESSMENT AND PLAN / ED COURSE  I reviewed the triage vital signs and the nursing notes. Patient's presentation is most consistent with acute presentation with potential threat to life or bodily function.  Patient presents with complaints of chest pain as detailed above, EKG, high sensitive troponin are reassuring.  No signs of pneumonia on x-ray.  On physical exam patient has evidence of zoster rash, this is consistent with his pain.  Will start the patient on valacyclovir , analgesics, outpatient follow-up with PCP recommended, return precautions discussed, he agrees with plan        FINAL CLINICAL IMPRESSION(S) /  ED DIAGNOSES   Final diagnoses:  Herpes zoster without complication     Rx / DC Orders   ED Discharge Orders          Ordered    valACYclovir  (VALTREX ) 1000 MG tablet  3 times daily        09/25/23 0952    traMADol  (ULTRAM ) 50 MG tablet  Every 6 hours PRN        09/25/23 9047             Note:  This document was prepared using Dragon voice recognition software and may include unintentional dictation errors.   Randall Charleston, MD 09/25/23 1335

## 2024-02-20 ENCOUNTER — Emergency Department

## 2024-02-20 ENCOUNTER — Emergency Department
Admission: EM | Admit: 2024-02-20 | Discharge: 2024-02-20 | Disposition: A | Discharge status: Home / Self Care | Attending: Emergency Medicine | Admitting: Emergency Medicine

## 2024-02-20 DIAGNOSIS — S82831A Other fracture of upper and lower end of right fibula, initial encounter for closed fracture: Secondary | ICD-10-CM | POA: Diagnosis not present

## 2024-02-20 DIAGNOSIS — W010XXA Fall on same level from slipping, tripping and stumbling without subsequent striking against object, initial encounter: Secondary | ICD-10-CM | POA: Insufficient documentation

## 2024-02-20 DIAGNOSIS — S99911A Unspecified injury of right ankle, initial encounter: Secondary | ICD-10-CM | POA: Diagnosis present

## 2024-02-20 NOTE — Discharge Instructions (Signed)
 Please wear the cam boot at all times and do not bear weight.  Use the crutches to get around.  Rest, ice, elevate your ankle.  Please follow-up with the podiatrist.  Please call tomorrow to make an appointment.  Please rest, ice, elevate your leg as much as possible.  Please return for any new, worsening, or change in symptoms or other concerns.  It was a pleasure caring for you today.

## 2024-02-20 NOTE — ED Triage Notes (Signed)
 Pt presents to the ED via POV from home with right ankle pain. Pt reports falling on the ice two days ago. Denies other injuries. Reports continued pain and swelling

## 2024-02-20 NOTE — ED Provider Notes (Signed)
 [facility] Provider Note    Event Date/Time   First MD Initiated Contact with Patient 02/20/24 1424     (approximate)   History   Ankle Pain   HPI  Randall [name] is a 73 y.o. male who presents today for evaluation of right ankle pain.  Patient reports that he had a slip and fall on ice 2 days ago and has had pain in his ankle ever since with difficulty with weightbearing.  No numbness or tingling.  No head strike or LOC.  Patient Active Problem List   Diagnosis Date Noted   Calcaneal fracture 12/10/2018          Physical Exam   Triage Vital Signs: ED Triage Vitals  Encounter Vitals Group     BP 02/20/24 1233 136/72     Girls Systolic BP Percentile --      Girls Diastolic BP Percentile --      Boys Systolic BP Percentile --      Boys Diastolic BP Percentile --      Pulse Rate 02/20/24 1233 (!) 103     Resp 02/20/24 1233 18     Temp 02/20/24 1233 98.2 F (36.8 C)     Temp Source 02/20/24 1233 Oral     SpO2 02/20/24 1233 97 %     Weight 02/20/24 1234 187 lb (84.8 kg)     Height 02/20/24 1234 5' 7 (1.702 m)     Head Circumference --      Peak Flow --      Pain Score 02/20/24 1233 6     Pain Loc --      Pain Education --      Exclude from Growth Chart --     Most recent vital signs: Vitals:   02/20/24 1233  BP: 136/72  Pulse: (!) 103  Resp: 18  Temp: 98.2 F (36.8 C)  SpO2: 97%    Physical Exam Vitals and nursing note reviewed.  Constitutional:      General: Awake and alert. No acute distress.    Appearance: Normal appearance. The patient is normal weight.  HENT:     Head: Normocephalic and atraumatic.     Mouth: Mucous membranes are moist.  Eyes:     General: PERRL. Normal EOMs        Right eye: No discharge.        Left eye: No discharge.     Conjunctiva/sclera: Conjunctivae normal.  Cardiovascular:     Rate and Rhythm: Normal rate and regular rhythm.     Pulses: Normal pulses.  Pulmonary:     Effort: Pulmonary  effort is normal. No respiratory distress.     Breath sounds: Normal breath sounds.  Abdominal:     Abdomen is soft. There is no abdominal tenderness. No rebound or guarding. No distention. Musculoskeletal:        General: No swelling. Normal range of motion.     Cervical back: Normal range of motion and neck supple.  Right ankle: Tenderness to palpation to lateral right ankle, no medial malleoli or tenderness.  Mild tenderness palpation to proximal fibula as well.  Normal pedal pulses.  No open wounds.  Sensation intact to light touch throughout.  Compartment soft and compressible throughout.  Able to wiggle all toes.  Foot is warm and well-perfused and equal to opposite. Skin:    General: Skin is warm and dry.     Capillary Refill: Capillary refill takes less than 2  seconds.     Findings: No rash.  Neurological:     Mental Status: The patient is awake and alert.      ED Results / Procedures / Treatments   Labs (all labs ordered are listed, but only abnormal results are displayed) Labs Reviewed - No data to display   EKG     RADIOLOGY I independently reviewed and interpreted imaging and agree with radiologists findings.     PROCEDURES:  Critical Care performed:   Procedures   MEDICATIONS ORDERED IN ED: Medications - No data to display   IMPRESSION / MDM / ASSESSMENT AND PLAN / ED COURSE  I reviewed the triage vital signs and the nursing notes.   Differential diagnosis includes, but is not limited to, fracture, dislocation, contusion, sprain.  Patient is awake and alert, hemodynamically stable and afebrile.  He is nontoxic in appearance.  He has swelling and tenderness over the lateral ankle, no medial malleolar tenderness.  He has normal pedal pulses.  Foot is warm and well-perfused.  X-ray of his ankle reveals a nondisplaced spiral fracture of the distal fibula, and chronic changes versus possible nondisplaced avulsion fracture of the distal tip of the medial  malleolus.  Patient has no tenderness to his medial malleolus and no swelling in this location.  X-ray tib-fib obtained does not reveal Maisonneuve fracture.  I consulted podiatry and discussed with Dr. Malvin who feels that cam boot and crutches is sufficient.  Patient will follow-up with podiatry as an outpatient.  Patient is in agreement with this plan.  Also discussed rest, ice, elevation.  Patient understands agrees with plan.  Discharged in stable condition with family member.   Patient's presentation is most consistent with acute complicated illness / injury requiring diagnostic workup.       FINAL CLINICAL IMPRESSION(S) / ED DIAGNOSES   Final diagnoses:  Closed fracture of distal end of right fibula, unspecified fracture morphology, initial encounter     Rx / DC Orders   ED Discharge Orders     None        Note:  This document was prepared using Dragon voice recognition software and may include unintentional dictation errors.   Cayleb Jarnigan E, PA-C 02/20/24 1515    Viviann Pastor, MD 02/21/24 1321

## 2024-02-28 ENCOUNTER — Ambulatory Visit

## 2024-02-28 ENCOUNTER — Ambulatory Visit: Admitting: Podiatry

## 2024-02-28 DIAGNOSIS — S82831A Other fracture of upper and lower end of right fibula, initial encounter for closed fracture: Secondary | ICD-10-CM | POA: Insufficient documentation

## 2024-02-28 DIAGNOSIS — S92901D Unspecified fracture of right foot, subsequent encounter for fracture with routine healing: Secondary | ICD-10-CM

## 2024-02-28 MED ORDER — OXYCODONE-ACETAMINOPHEN 5-325 MG PO TABS: 1.0000 | ORAL_TABLET | ORAL | 0 refills | Status: AC | PRN

## 2024-02-28 NOTE — Progress Notes (Signed)
°  Subjective:  Patient ID: Rosalita DELENA Sharps, male    DOB: [birth year],  MRN: [id number]  Chief Complaint  Patient presents with   Fracture    F/ u Right foot fracture. 5 pain and swelling. Non diabetic.     73 y.o. male presents as referral from [facility] emergency department due to concern for right nondisplaced spiral distal fibular fracture.  He was placed in a splint versus cam boot at the time he has a cam boot on this morning reports he has been nonweightbearing with aid of crutches and the knee scooter.  History reviewed. No pertinent past medical history.  Allergies[1]  ROS: Negative except as per HPI above  Objective:  General: AAO x3, NAD  Dermatological: With inspection and palpation of the right and left lower extremities there are no open sores, no preulcerative lesions, no rash or signs of infection present. Nails are of normal length thickness and coloration.   Vascular:  Dorsalis Pedis artery and Posterior Tibial artery pedal pulses are 2/4 bilateral.  Capillary fill time < 3 sec to all digits.   Neruologic: Grossly intact via light touch bilateral. Protective threshold intact to all sites bilateral.   Musculoskeletal: Pain and edema of the right lateral ankle no evidence of pain on the medial malleolus with palpation  Gait: Unassisted, Nonantalgic.   No images are attached to the encounter.  Radiographs:  02/20/2024 from the ED XR ankle right: 1. Nondisplaced spiral fracture of the distal fibula. 2. Chronic changes versus possible nondisplaced avulsion fracture of the distal tip of the medial malleolus.  Date: 02/28/2024 XR right ankle non weightbearing AP/Lateral/Oblique   Findings: Similar appearance of fracture seen from the ED x-rays with nondisplaced spiral fracture of the distal fibula no obvious fracture of the malleolus  Assessment:   1. Closed fracture of distal end of right fibula, unspecified fracture morphology, initial  encounter      Plan:  Patient was evaluated and treated and all questions answered.  # Closed fracture of distal right fibula spiral fracture with mild comminution and nondisplaced, no evidence of medial injury -Discussed surgical versus nonoperative management in detail with the patient - Discussed with patient that given there is no medial pain and his fracture is relatively stable and nondisplaced on the lateral malleolus we can proceed with nonoperative management.  Patient is agreeable with this and would prefer nonoperative management - Discussed that we will have to remain nonweightbearing for another 3 weeks in the cam boot and then he can begin to walk in the cam boot - Recommend ace wraps for edema control -Recommend vitamin D and calcium supplementation over-the-counter - Follow-up in 4 weeks for recheck with repeat x-rays - Discussed that if there is failure for him to heal would have to consider surgical intervention as needed in the future  No follow-ups on file.          Marolyn JULIANNA Honour, DPM [facility]                         [1]  Allergies Allergen Reactions   Quetiapine Other (See Comments)    Suicidal thoughts

## 2024-04-03 ENCOUNTER — Ambulatory Visit (INDEPENDENT_AMBULATORY_CARE_PROVIDER_SITE_OTHER)

## 2024-04-03 ENCOUNTER — Ambulatory Visit: Admitting: Podiatry

## 2024-04-03 ENCOUNTER — Encounter: Payer: Self-pay | Admitting: Podiatry

## 2024-04-03 DIAGNOSIS — S82831A Other fracture of upper and lower end of right fibula, initial encounter for closed fracture: Secondary | ICD-10-CM

## 2024-04-03 DIAGNOSIS — S82831D Other fracture of upper and lower end of right fibula, subsequent encounter for closed fracture with routine healing: Secondary | ICD-10-CM | POA: Diagnosis not present

## 2024-04-03 MED ORDER — GABAPENTIN 300 MG PO CAPS: 300.0000 mg | ORAL_CAPSULE | Freq: Every day | ORAL | 0 refills | Status: AC

## 2024-04-03 MED ORDER — OXYCODONE-ACETAMINOPHEN 5-325 MG PO TABS: 1.0000 | ORAL_TABLET | ORAL | 0 refills | Status: AC | PRN

## 2024-04-03 NOTE — Progress Notes (Signed)
 "  Subjective:  Patient ID: Randall [name], male    DOB: 18-Jun-1950,  MRN: [id number]  Chief Complaint  Patient presents with   Foot Injury     Closed fracture of distal end of right fibula. 3 pain. Wearing pneumatic cast.       74 y.o. male presents for follow-up for right ankle fracture.  He has been weightbearing in cam boot for about a week.  Says he is having some soreness.  He is wearing the boot tries to wear it at all times.  He does have some burning and tingling he does not have any Percocet or statin.  History reviewed. No pertinent past medical history.  Allergies[1]  ROS: Negative except as per HPI above  Objective:  General: AAO x3, NAD  Dermatological: With inspection and palpation of the right and left lower extremities there are no open sores, no preulcerative lesions, no rash or signs of infection present. Nails are of normal length thickness and coloration.   Vascular:  Dorsalis Pedis artery and Posterior Tibial artery pedal pulses are 2/4 bilateral.  Capillary fill time < 3 sec to all digits.   Neruologic: Grossly intact via light touch bilateral. Protective threshold intact to all sites bilateral.   Musculoskeletal: Decreased pain and decreased edema of the right lateral ankle.  Good range of motion though does produce some pain when he ranges and does dorsiflexion plantarflexion  Gait: Unassisted, Nonantalgic.   No images are attached to the encounter.  Radiographs:  02/20/2024 from the ED XR ankle right: 1. Nondisplaced spiral fracture of the distal fibula. 2. Chronic changes versus possible nondisplaced avulsion fracture of the distal tip of the medial malleolus.  Date: 02/28/2024 XR right ankle non weightbearing AP/Lateral/Oblique   Findings: Similar appearance of fracture seen from the ED x-rays with nondisplaced spiral fracture of the distal fibula no obvious fracture of the malleolus   Date 04/03/2024 XR right ankle nonweightbearing AP lateral  oblique: Findings: Attention directed to the distal fibula there is noted to be healing fracture of the distal fibula eosinophil noted on x-ray.  Does not appear to be significant malalignment or displacement from prior x-ray.  Overall healing in acceptable position. Assessment:   1. Closed fracture of distal end of right fibula with routine healing, unspecified fracture morphology, subsequent encounter      Plan:  Patient was evaluated and treated and all questions answered.  # Closed fracture of distal right fibula spiral fracture with mild comminution and nondisplaced, no evidence of medial injury - Patient improving with nonoperative management.  Will continue with current plan.  Weightbearing as tolerated cam boot with activity level - Recommend decrease activity level is much as he is able remain in the cam boot for another month.  Recommend compression of some type with either Ace wrap or compression stocking for edema control - Continue vitamin D supplementation OTC - Follow-up in 4 weeks for recheck with repeat x-rays - Do not believe he will need any type of surgical intervention as the fracture does appear to be healing in acceptable position.  Slightly slow healing though about what I would expect for his age and comorbidities.   No follow-ups on file.          Randall [name], DPM [facility]                         [1]  Allergies Allergen Reactions  Quetiapine Other (See Comments)    Suicidal thoughts   "

## 2024-04-03 NOTE — Addendum Note (Signed)
 Addended by: MALVIN RUE F on: 04/03/2024 09:40 AM   Modules accepted: Orders

## 2024-05-01 ENCOUNTER — Ambulatory Visit: Admitting: Podiatry
